# Patient Record
Sex: Male | Born: 1965 | Race: White | Hispanic: No | Marital: Single | Smoking: Former smoker
Health system: Southern US, Community
[De-identification: ages and names within clinical notes are randomized; demographics above are authoritative.]

## PROBLEM LIST (undated history)

## (undated) DIAGNOSIS — E785 Hyperlipidemia, unspecified: Secondary | ICD-10-CM

## (undated) DIAGNOSIS — F419 Anxiety disorder, unspecified: Secondary | ICD-10-CM

## (undated) DIAGNOSIS — I10 Essential (primary) hypertension: Secondary | ICD-10-CM

## (undated) DIAGNOSIS — E669 Obesity, unspecified: Secondary | ICD-10-CM

## (undated) DIAGNOSIS — I4891 Unspecified atrial fibrillation: Secondary | ICD-10-CM

## (undated) HISTORY — DX: Unspecified atrial fibrillation: I48.91

## (undated) HISTORY — DX: Obesity, unspecified: E66.9

## (undated) HISTORY — PX: HERNIA REPAIR: SHX51

## (undated) HISTORY — PX: OTHER SURGICAL HISTORY: SHX169

## (undated) HISTORY — PX: APPENDECTOMY: SHX54

## (undated) HISTORY — PX: HIP SURGERY: SHX245

---

## 2010-02-21 ENCOUNTER — Ambulatory Visit: Payer: Self-pay | Admitting: Family Medicine

## 2011-01-16 NOTE — Assessment & Plan Note (Signed)
Summary: NOV sinusitis   Vital Signs:  Patient profile:   45 year old male Height:      70 inches Weight:      245 pounds BMI:     35.28 O2 Sat:      98 % on Room air Temp:     98.2 degrees F oral Pulse rate:   85 / minute BP sitting:   120 / 82  (left arm) Cuff size:   large  Vitals Entered By: Payton Spark CMA (February 21, 2010 3:58 PM)  O2 Flow:  Room air CC: New to est. New to [place]. ST, sinus drainage, and cough x 2 weeks. 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    Primary Care Provider:  Seymour Bars DO  CC:  New to est. New to [place]. ST, sinus drainage, and and cough x 2 weeks. 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 Marland Kitchen  History of Present Illness: 45 yo WM presents for NOV.  He is having problems with sore throat, head congestion and cough x 2 wks.  His cough keeps him up at night.  He thinks that he has some allergies.  No ocular symptoms.  No fevers.  He usually gets a sinus infection every Winter.  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    Current Medications (verified): 1)  None  Allergies (verified): No Known Drug Allergies  Past History:  Past Medical History: obesity born with dislocated hips  Past Surgical History: hip surgery x 14 bilat hernia surgery  appy with partial colon resection  Family History: father died at 24 from CVA, HTN, ETOH mother HTN 4 sibblings healthy  Social History: Education officer, museum at [employer]. Smokes 1 ppd x 30 yrs. 3 ETOH /w k. Single.  Not in any relationships.  No kids. No regular exercise.  Review of Systems       no fevers/sweats/weakness, unexplained wt loss/gain, no change in vision, no difficulty hearing, ringing in ears,+ hay fever/allergies, no CP/discomfort, no palpitations, no breast lump/nipple discharge, + cough/wheeze, no blood in stool, no N/V/D, +  nocturia, no leaking urine, no unusual vag bleeding, no vaginal/penile discharge, no muscle/joint pain, no rash, no new/changing mole, no HA, no memory loss, no anxiety, no sleep problem, + depression, no unexplained lumps, no easy bruising/bleeding, no concern with sexual function   Physical Exam  General:  obese WM in NAD Head:  normocephalic and atraumatic.  sinuses NTTP Eyes:  conjunctiva clear Ears:  EACs patent; TMs translucent and gray with good cone of light and bony landmarks.  Nose:  copious nasal congestion Mouth:  throat mildly injected Neck:  no masses.   Lungs:  Normal respiratory effort, chest expands symmetrically. Lungs are clear to auscultation, no crackles or wheezes. dry cough Heart:  Normal rate and regular rhythm. S1 and S2 normal without gallop, murmur, click, rub or other extra sounds. Extremities:  no LE edema Skin:  color normal.   Cervical Nodes:  No lymphadenopathy noted   Impression & Recommendations:  Problem # 1:  ACUTE SINUSITIS, UNSPECIFIED (ICD-461.9) Secondary to initial URI.  Treat with Zithromax and Claritin D along with as needed Advil for HA.  Avoid smoking.  Call if not improved in 10 days. He will schedule a CPE with fasting labs in the next 2 mos.  His updated medication list for this problem includes:    Zithromax Z-pak 250 Mg Tabs (Azithromycin) .Marland Kitchen... 2 tabs by mouth x 1 day then 1 tab by mouth daily x 4 days    Claritin-d 24 Hour 10-240 Mg Xr24h-tab (Loratadine-pseudoephedrine) .Marland Kitchen... 1 tab by mouth qam  Complete Medication List: 1)  Zithromax Z-pak 250 Mg Tabs (Azithromycin) .... 2 tabs by mouth x 1 day then 1 tab by mouth daily x 4 days 2)  Claritin-d 24 Hour 10-240 Mg Xr24h-tab (Loratadine-pseudoephedrine) .Marland Kitchen.. 1 tab by mouth qam 3)  Zolpidem Tartrate 10 Mg Tabs (Zolpidem tartrate) .Marland Kitchen.. 1 tab by mouth at bedtime as needed sleep  Patient Instructions: 1)  Take Zithromax for sinusitis along with OTC Claritin -D for head congestion. 2)   Try Zolpidem at bedtime for sleep. 3)  Return for a physical with labs in 4 wks. Prescriptions: ZOLPIDEM TARTRATE 10 MG TABS (ZOLPIDEM TARTRATE) 1 tab by mouth at bedtime as needed sleep  #30 x 0   Entered and Authorized by:   Seymour Bars DO   Signed by:   Seymour Bars DO on 02/21/2010   Method used:   Printed then faxed to ...       [address] [phone]* (retail)       [address] [place], [place]  [address]       Ph: [phone]       Fax: [phone]   RxID:   [id number] ZITHROMAX Z-PAK 250 MG TABS (AZITHROMYCIN) 2 tabs by mouth x 1 day then 1 tab by mouth daily x 4 days  #1 pack x 0   Entered and Authorized by:   Seymour Bars DO   Signed by:   Seymour Bars DO on 02/21/2010   Method used:   Electronically to        [facility] [phone]* (retail)       [address] [place], [place]  [address]       Ph: [phone]       Fax: [phone]   RxID:   [id number]

## 2011-07-18 ENCOUNTER — Encounter: Payer: Self-pay | Admitting: Family Medicine

## 2011-07-19 ENCOUNTER — Other Ambulatory Visit: Payer: Self-pay | Admitting: Family Medicine

## 2011-07-19 ENCOUNTER — Encounter: Payer: Self-pay | Admitting: Family Medicine

## 2011-07-19 ENCOUNTER — Ambulatory Visit (INDEPENDENT_AMBULATORY_CARE_PROVIDER_SITE_OTHER): Payer: BC Managed Care – PPO | Admitting: Family Medicine

## 2011-07-19 DIAGNOSIS — Z13228 Encounter for screening for other metabolic disorders: Secondary | ICD-10-CM

## 2011-07-19 DIAGNOSIS — Z0000 Encounter for general adult medical examination without abnormal findings: Secondary | ICD-10-CM

## 2011-07-19 DIAGNOSIS — N139 Obstructive and reflux uropathy, unspecified: Secondary | ICD-10-CM

## 2011-07-19 DIAGNOSIS — Z130 Encounter for screening for diseases of the blood and blood-forming organs and certain disorders involving the immune mechanism: Secondary | ICD-10-CM

## 2011-07-19 DIAGNOSIS — Z13220 Encounter for screening for lipoid disorders: Secondary | ICD-10-CM

## 2011-07-19 DIAGNOSIS — Z131 Encounter for screening for diabetes mellitus: Secondary | ICD-10-CM

## 2011-07-19 DIAGNOSIS — R635 Abnormal weight gain: Secondary | ICD-10-CM

## 2011-07-19 MED ORDER — BUPROPION HCL ER (XL) 150 MG PO TB24: 150.0000 mg | ORAL_TABLET | ORAL | Status: DC

## 2011-07-19 NOTE — Patient Instructions (Signed)
Fasting labs today. Will call you w/ results tomorrow.  Start Wellbutrin every morning for mood.  It will take about 3 wks to really start working. Call if any problems.  Work on [employer], regular exercise, wt loss.  Take a Multivitamin daily.  Call if you want to see a urologist.  Return for f/u mood in 6 wks.

## 2011-07-19 NOTE — Progress Notes (Signed)
  Subjective:    Patient ID: Albert Humphrey, male    DOB: 1966/07/14, 45 y.o.   MRN: [id number]  HPI  45 yo WM presents for CPE.  He has not been seen in over a year.  He has been feeling OK.  He is a smoker.  He is having to get up 3-4 x at night to void.  He is having incomplete voiding.  He has never had urology problems.  He is thirsty all the time.  He has fam hx of depression.  He has a fam hx of premature stroke.  He has gained more weight and admits to feeling depressed especially when he is alone.    BP 133/93  Pulse 90  Ht 5\' 10"  (1.778 m)  Wt 271 lb (122.925 kg)  BMI 38.88 kg/m2  SpO2 95%  Review of Systems  Constitutional: Positive for unexpected weight change. Negative for fatigue.  Eyes: Negative for visual disturbance.  Respiratory: Negative for shortness of breath.   Cardiovascular: Negative for chest pain, palpitations and leg swelling.  Gastrointestinal: Negative for blood in stool.  Genitourinary: Positive for frequency and difficulty urinating.  Psychiatric/Behavioral: Positive for sleep disturbance and dysphoric mood. The patient is nervous/anxious.        Objective:   Physical Exam  Constitutional: He appears well-developed and well-nourished.  HENT:  Nose: Nose normal.  Mouth/Throat: Oropharynx is clear and moist.  Eyes: Conjunctivae are normal. No scleral icterus.  Neck: No thyromegaly present.  Cardiovascular: Normal rate, regular rhythm and normal heart sounds.   Pulmonary/Chest: Effort normal and breath sounds normal.  Abdominal: Soft. Bowel sounds are normal. He exhibits no distension. There is no tenderness.  Genitourinary: Prostate normal. Guaiac negative stool.  Musculoskeletal: He exhibits edema (1+ nonpitting LE edema).  Lymphadenopathy:    He has no cervical adenopathy.  Skin: Skin is warm and dry.  Psychiatric: He has a normal mood and affect.          Assessment & Plan:  Assesment:  1. CPE- Keeping healthy checklist for men reviewed  today.  BP at goal.  BMI 38.8 in the class II obesity range.     Labs ordered today.  prostate exam is normal today.  I suggested he cut back on evening intake of diet coke and call if urinary symptoms do not improve. Encouraged healthy diet, regular exercise, MVI daily. Return for next physical in 1 yr.   He thinks his Tetanus is UTD. Start Wellbutrin for for mood and call if any problems.  RTC for f/u in 6 wks.

## 2011-07-20 ENCOUNTER — Telehealth: Payer: Self-pay | Admitting: Family Medicine

## 2011-07-20 LAB — PSA: PSA: 0.51 ng/mL (ref <=4.00)

## 2011-07-20 LAB — COMPLETE METABOLIC PANEL WITH GFR
ALT: 35 U/L (ref 0–53)
AST: 25 U/L (ref 0–37)
Albumin: 4.6 g/dL (ref 3.5–5.2)
Alkaline Phosphatase: 68 U/L (ref 39–117)
BUN: 17 mg/dL (ref 6–23)
CO2: 25 mEq/L (ref 19–32)
Calcium: 9.9 mg/dL (ref 8.4–10.5)
Chloride: 104 mEq/L (ref 96–112)
Creat: 0.62 mg/dL (ref 0.50–1.35)
GFR, Est African American: >60 mL/min (ref >60)
GFR, Est Non African American: >60 mL/min (ref >60)
Glucose, Bld: 107 mg/dL — ABNORMAL HIGH (ref 70–99)
Potassium: 4.5 mEq/L (ref 3.5–5.3)
Sodium: 140 mEq/L (ref 135–145)
Total Bilirubin: 0.5 mg/dL (ref 0.3–1.2)
Total Protein: 7.6 g/dL (ref 6.0–8.3)

## 2011-07-20 LAB — HEMOGLOBIN A1C
Hgb A1c MFr Bld: 5.8 % — ABNORMAL HIGH (ref <5.7)
Mean Plasma Glucose: 120 mg/dL — ABNORMAL HIGH (ref <117)

## 2011-07-20 LAB — TSH: TSH: 1.39 u[IU]/mL (ref 0.350–4.500)

## 2011-07-20 LAB — LIPID PANEL
Cholesterol: 322 mg/dL — ABNORMAL HIGH (ref 0–200)
HDL: 42 mg/dL (ref >39)
Total CHOL/HDL Ratio: 7.7 Ratio
Triglycerides: 520 mg/dL — ABNORMAL HIGH (ref <150)

## 2011-07-20 LAB — LDL CHOLESTEROL, DIRECT: Direct LDL: 161 mg/dL — ABNORMAL HIGH

## 2011-07-20 NOTE — Telephone Encounter (Signed)
Lab added

## 2011-07-20 NOTE — Telephone Encounter (Signed)
Pls see if the lab will add an a direct LDL dx: hyperlpidemia

## 2011-07-24 ENCOUNTER — Telehealth: Payer: Self-pay | Admitting: Family Medicine

## 2011-07-24 DIAGNOSIS — E785 Hyperlipidemia, unspecified: Secondary | ICD-10-CM

## 2011-07-24 MED ORDER — NIACIN-SIMVASTATIN ER 500-20 MG PO TB24: 1.0000 | ORAL_TABLET | Freq: Every day | ORAL | Status: DC

## 2011-07-24 NOTE — Telephone Encounter (Signed)
Pt called to get his lab results.  When looking back in the telephone notes the nurse had already tried to reach him earlier today. Plan:  Albert Humphrey over all the orders and instructions that the nurse had given him on voice mail message earlier today. [name], LPN [name]

## 2011-07-24 NOTE — Telephone Encounter (Signed)
Pls let pt know that his cholesterol came back very high with both very high TGs > 500 and an LDL of 161 which should be <130.  Will need to start him on medication to reduce this as well as to work on healthy diet and regular exercise.  Will start Simcor once daily at bedtime.  Call if any problems.  Recheck FLP with LFTs in 2 mos.

## 2011-10-08 ENCOUNTER — Ambulatory Visit (INDEPENDENT_AMBULATORY_CARE_PROVIDER_SITE_OTHER): Payer: BC Managed Care – PPO | Admitting: Family Medicine

## 2011-10-08 ENCOUNTER — Encounter: Payer: Self-pay | Admitting: Family Medicine

## 2011-10-08 VITALS — BP 141/89 | HR 98 | Wt 252.0 lb

## 2011-10-08 DIAGNOSIS — I10 Essential (primary) hypertension: Secondary | ICD-10-CM

## 2011-10-08 DIAGNOSIS — F419 Anxiety disorder, unspecified: Secondary | ICD-10-CM | POA: Insufficient documentation

## 2011-10-08 DIAGNOSIS — R030 Elevated blood-pressure reading, without diagnosis of hypertension: Secondary | ICD-10-CM

## 2011-10-08 DIAGNOSIS — F411 Generalized anxiety disorder: Secondary | ICD-10-CM

## 2011-10-08 DIAGNOSIS — R7309 Other abnormal glucose: Secondary | ICD-10-CM

## 2011-10-08 DIAGNOSIS — IMO0001 Reserved for inherently not codable concepts without codable children: Secondary | ICD-10-CM

## 2011-10-08 DIAGNOSIS — E785 Hyperlipidemia, unspecified: Secondary | ICD-10-CM

## 2011-10-08 NOTE — Progress Notes (Signed)
  Subjective:    Patient ID: Albert Humphrey, male    DOB: 23-Feb-1966, 45 y.o.   MRN: [id number]  HPI  Anxiety - doing well on the 150. No side effects but says it works some but not great.   Lipids- Has been on weight watchers and has lost 17 lbs. Toleratig the simcor well but says it is $70 per months  No myalgias.    Review of Systems     Objective:   Physical Exam  Constitutional: He is oriented to person, place, and time. He appears well-developed and well-nourished.  HENT:  Head: Normocephalic and atraumatic.  Cardiovascular: Normal rate, regular rhythm and normal heart sounds.   Pulmonary/Chest: Effort normal and breath sounds normal.  Neurological: He is alert and oriented to person, place, and time.  Skin: Skin is warm and dry.  Psychiatric: He has a normal mood and affect. His behavior is normal.          Assessment & Plan:  Anxiety - Discussed trial of increasing to 300mg  a day. FU in 8 weeks. Perform GAD-7 at that time. If still not at goal, consider an SSRI.   Hyperlipidemia - Will recheck levels today. Adjust med but may need to separate components bc of cost. He can also check online for a coupon card. Keep up the weight loss. This should improve his numbers.   BP elevated - Recheck in 8 weeks. Congratulated him on his weight loss and encouraged him to keep it up. Hopefully BP will look even better.

## 2011-10-08 NOTE — Patient Instructions (Addendum)
We will call you with your lab results and see if we need to adjust your dose. Hypercholesterolemia High Blood Cholesterol Cholesterol is a white, waxy, fat-like protein needed by your body in small amounts. The liver makes all the cholesterol you need. It is carried from the liver by the blood through the blood vessels. Deposits (plaque) may build up on blood vessel walls. This makes the arteries narrower and stiffer. Plaque increases the risk for heart attack and stroke. You cannot feel your cholesterol level even if it is very high. The only way to know is by a blood test to check your lipid (fats) levels. Once you know your cholesterol levels, you should keep a record of the test results. Work with your caregiver to to keep your levels in the desired range. WHAT THE RESULTS MEAN:  Total cholesterol is a rough measure of all the cholesterol in your blood.     LDL is the so-called bad cholesterol. This is the type that deposits cholesterol in the walls of the arteries. You want this level to be low.     HDL is the good cholesterol because it cleans the arteries and carries the LDL away. You want this level to be high.     Triglycerides are fat that the body can either burn for energy or store. High levels are closely linked to heart disease.  DESIRED LEVELS:  Total cholesterol below 200.     LDL below 100 for people at risk, below 70 for very high risk.     HDL above 50 is good, above 60 is best.     Triglycerides below 150.  HOW TO LOWER YOUR CHOLESTEROL:  Diet.     Choose fish or white meat chicken and [place], roasted or baked. Limit fatty cuts of red meat, fried foods, and processed meats, such as sausage and lunch meat.     Eat lots of fresh fruits and vegetables. Choose whole grains, beans, pasta, potatoes and cereals.     Use only small amounts of olive, corn or canola oils. Avoid butter, mayonnaise, shortening or palm kernel oils. Avoid foods with trans-fats.     Use  skim/nonfat milk and low-fat/nonfat yogurt and cheeses. Avoid whole milk, cream, ice cream, egg yolks and cheeses. Healthy desserts include angel food cake, gingersnaps, animal crackers, hard candy, popsicles, and low-fat/nonfat frozen yogurt. Avoid pastries, cakes, pies and cookies.     Exercise.    A regular program helps decrease LDL and raises HDL.     Helps with weight control.     Do things that increase your activity level like gardening, walking, or taking the stairs.     Medication.    May be prescribed by your caregiver to help lowering cholesterol and the risk for heart disease.     You may need medicine even if your levels are normal if you have several risk factors.  HOME CARE INSTRUCTIONS    Follow your diet and exercise programs as suggested by your caregiver.     Take medications as directed.     Have blood work done when your caregiver feels it is necessary.  MAKE SURE YOU:    Understand these instructions.     Will watch your condition.     Will get help right away if you are not doing well or get worse.  Document Released: 12/03/2005 Document Revised: 08/15/2011 Document Reviewed: 05/21/2007 [facility] Patient Information 2012 [place], [place].

## 2011-10-09 LAB — COMPLETE METABOLIC PANEL WITH GFR
ALT: 26 U/L (ref 0–53)
AST: 25 U/L (ref 0–37)
Albumin: 4.9 g/dL (ref 3.5–5.2)
Alkaline Phosphatase: 86 U/L (ref 39–117)
BUN: 12 mg/dL (ref 6–23)
CO2: 24 mEq/L (ref 19–32)
Calcium: 9.8 mg/dL (ref 8.4–10.5)
Chloride: 105 mEq/L (ref 96–112)
Creat: 0.82 mg/dL (ref 0.50–1.35)
GFR, Est African American: >90 mL/min (ref >90)
GFR, Est Non African American: >90 mL/min (ref >90)
Glucose, Bld: 85 mg/dL (ref 70–99)
Potassium: 4.9 mEq/L (ref 3.5–5.3)
Sodium: 142 mEq/L (ref 135–145)
Total Bilirubin: 0.4 mg/dL (ref 0.3–1.2)
Total Protein: 7.6 g/dL (ref 6.0–8.3)

## 2011-10-09 LAB — LIPID PANEL
Cholesterol: 189 mg/dL (ref 0–200)
HDL: 39 mg/dL — ABNORMAL LOW (ref >39)
LDL Cholesterol: 89 mg/dL (ref 0–99)
Total CHOL/HDL Ratio: 4.8 Ratio
Triglycerides: 304 mg/dL — ABNORMAL HIGH (ref <150)
VLDL: 61 mg/dL — ABNORMAL HIGH (ref 0–40)

## 2011-10-09 LAB — LDL CHOLESTEROL, DIRECT: Direct LDL: 108 mg/dL — ABNORMAL HIGH

## 2011-10-11 ENCOUNTER — Telehealth: Payer: Self-pay | Admitting: *Deleted

## 2011-10-11 MED ORDER — NIACIN ER (ANTIHYPERLIPIDEMIC) 500 MG PO TBCR: 500.0000 mg | EXTENDED_RELEASE_TABLET | Freq: Every day | ORAL | Status: DC

## 2011-10-11 MED ORDER — SIMVASTATIN 20 MG PO TABS: 20.0000 mg | ORAL_TABLET | Freq: Every day | ORAL | Status: DC

## 2011-10-11 MED ORDER — BUPROPION HCL ER (XL) 300 MG PO TB24: 300.0000 mg | ORAL_TABLET | ORAL | Status: DC

## 2011-10-11 NOTE — Telephone Encounter (Signed)
Pt could not find a coupon for the symbicort so would like for you to send the separate medications. Also states there was supposed to be a rx sent for wellbutrin for a higher strength

## 2011-10-11 NOTE — Telephone Encounter (Signed)
Message copied by Wyline Beady on Thu Oct 11, 2011  8:52 AM ------      Message from: Nani Gasser D      Created: Thu Oct 11, 2011  6:40 AM       Total cholesterol and triglycerides looked much better. Complete metabolic panel is normal. Please let me know if he was able to find a coupon voucher online for the Symbicort. If he was not then let me know and I will  separate out the medication and this should hopefully be cheaper for him. But it certainly is helping his cholesterol greatly, in addition to his significant weight loss. Recheck cholesterol in 6 months.

## 2011-10-11 NOTE — Telephone Encounter (Signed)
rx sent

## 2011-10-11 NOTE — Telephone Encounter (Signed)
Left message on vm with results  

## 2011-10-11 NOTE — Telephone Encounter (Signed)
Pt returning call regarding his lab results. Plan:  When pt call was returned he said he had already talked to someone.  Pt just waiting on the scripts to go to his pharmacy that are suppose to be sent.  Albert Newcomer, LPN [name]

## 2011-10-12 ENCOUNTER — Telehealth: Payer: Self-pay | Admitting: Family Medicine

## 2011-10-12 NOTE — Telephone Encounter (Signed)
Pt informed by [facility] for him that the higher dose of Wellbutrin was sent to his pharmacy. [name], LPN [name]

## 2011-10-15 NOTE — Telephone Encounter (Signed)
Closed

## 2012-07-01 ENCOUNTER — Ambulatory Visit (INDEPENDENT_AMBULATORY_CARE_PROVIDER_SITE_OTHER): Payer: BC Managed Care – PPO | Admitting: Family Medicine

## 2012-07-01 ENCOUNTER — Ambulatory Visit (INDEPENDENT_AMBULATORY_CARE_PROVIDER_SITE_OTHER): Payer: BC Managed Care – PPO

## 2012-07-01 ENCOUNTER — Encounter: Payer: Self-pay | Admitting: Family Medicine

## 2012-07-01 VITALS — BP 133/88 | HR 75 | Ht 70.0 in | Wt 274.0 lb

## 2012-07-01 DIAGNOSIS — F17200 Nicotine dependence, unspecified, uncomplicated: Secondary | ICD-10-CM

## 2012-07-01 DIAGNOSIS — R05 Cough: Secondary | ICD-10-CM

## 2012-07-01 DIAGNOSIS — E785 Hyperlipidemia, unspecified: Secondary | ICD-10-CM

## 2012-07-01 DIAGNOSIS — J40 Bronchitis, not specified as acute or chronic: Secondary | ICD-10-CM

## 2012-07-01 DIAGNOSIS — Q6589 Other specified congenital deformities of hip: Secondary | ICD-10-CM

## 2012-07-01 DIAGNOSIS — Q659 Congenital deformity of hip, unspecified: Secondary | ICD-10-CM | POA: Insufficient documentation

## 2012-07-01 DIAGNOSIS — Z720 Tobacco use: Secondary | ICD-10-CM

## 2012-07-01 DIAGNOSIS — Z23 Encounter for immunization: Secondary | ICD-10-CM

## 2012-07-01 DIAGNOSIS — R0602 Shortness of breath: Secondary | ICD-10-CM

## 2012-07-01 DIAGNOSIS — R059 Cough, unspecified: Secondary | ICD-10-CM

## 2012-07-01 LAB — COMPLETE METABOLIC PANEL WITH GFR
ALT: 22 U/L (ref 0–53)
AST: 20 U/L (ref 0–37)
Albumin: 4.4 g/dL (ref 3.5–5.2)
Alkaline Phosphatase: 66 U/L (ref 39–117)
BUN: 10 mg/dL (ref 6–23)
CO2: 27 mEq/L (ref 19–32)
Calcium: 10.5 mg/dL (ref 8.4–10.5)
Chloride: 106 mEq/L (ref 96–112)
Creat: 0.79 mg/dL (ref 0.50–1.35)
GFR, Est African American: >89 mL/min
GFR, Est Non African American: >89 mL/min
Glucose, Bld: 105 mg/dL — ABNORMAL HIGH (ref 70–99)
Potassium: 5.1 mEq/L (ref 3.5–5.3)
Sodium: 141 mEq/L (ref 135–145)
Total Bilirubin: 0.4 mg/dL (ref 0.3–1.2)
Total Protein: 7.4 g/dL (ref 6.0–8.3)

## 2012-07-01 LAB — LIPID PANEL
Cholesterol: 244 mg/dL — ABNORMAL HIGH (ref 0–200)
HDL: 36 mg/dL — ABNORMAL LOW (ref >39)
LDL Cholesterol: 129 mg/dL — ABNORMAL HIGH (ref 0–99)
Total CHOL/HDL Ratio: 6.8 Ratio
Triglycerides: 397 mg/dL — ABNORMAL HIGH (ref <150)
VLDL: 79 mg/dL — ABNORMAL HIGH (ref 0–40)

## 2012-07-01 MED ORDER — BUPROPION HCL ER (XL) 300 MG PO TB24: 300.0000 mg | ORAL_TABLET | ORAL | Status: DC

## 2012-07-01 MED ORDER — SIMVASTATIN 40 MG PO TABS: 40.0000 mg | ORAL_TABLET | Freq: Every day | ORAL | Status: DC

## 2012-07-01 NOTE — Patient Instructions (Addendum)
We will call you with the chest xray results.  

## 2012-07-01 NOTE — Progress Notes (Signed)
Subjective:    Patient ID: Albert Humphrey, male    DOB: [birth year], 46 y.o.   MRN: [id number]  HPI Says off of all meds. Says has been more irritated and easily annoyed  Has been seeing ortho specialist for his right hip ( has had multiple surgeries since birth). Told hip replacement is the only thing to correct it.  He doesn't know what to do.  Would like to have a handicap sticker form completed.  Was given tramadol.    Tob abuse - Stopped the last 2 days . He says he is currently really struggling and feels very on it since stopping smoking 2 days ago.  Hyperlipidemia - Off of meds since Jan. No CP or SOB.    Productive cough x 2 weeks with SOB. No fever.  Had severe back pain on the left with the cough.  No cold meds.  No worsening or alleviating symptoms. In fact he says he feels slightly better today. No nasal symptoms predominantly in the chest. He says he has noticed a slight wheeze as well. No known prior history of pulmonary disorders.   Review of Systems BP 133/88  Pulse 75  Ht 5\' 10"  (1.778 m)  Wt 274 lb (124.286 kg)  BMI 39.32 kg/m2    No Known Allergies  Past Medical History  Diagnosis Date  . Obesity   . Dislocation of hip     At birth    Past Surgical History  Procedure Date  . Hip surgery   . Hernia repair   . Appendectomy   . Ectinartial colon resection     partial    History   Social History  . Marital Status: Single    Spouse Name: N/A    Number of Children: N/A  . Years of Education: N/A   Occupational History  . Not on file.   Social History Main Topics  . Smoking status: Current Everyday Smoker -- 1.0 packs/day for 30 years    Types: Cigarettes  . Smokeless tobacco: Not on file  . Alcohol Use: 1.5 oz/week    3 drink(s) per week     per week  . Drug Use:   . Sexually Active:    Other Topics Concern  . Not on file   Social History Narrative  . No narrative on file    Family History  Problem Relation Age of Onset  . Hypertension  Mother   . Hypertension Father   . Alcohol abuse Father   . Heart disease Father     CVA    Outpatient Encounter Prescriptions as of 07/01/2012  Medication Sig Dispense Refill  . buPROPion (WELLBUTRIN XL) 300 MG 24 hr tablet Take 1 tablet (300 mg total) by mouth every morning.  30 tablet  2  . niacin (NIASPAN) 500 MG CR tablet Take 1 tablet (500 mg total) by mouth at bedtime.  30 tablet  11  . simvastatin (ZOCOR) 40 MG tablet Take 1 tablet (40 mg total) by mouth at bedtime.  30 tablet  4  . DISCONTD: buPROPion (WELLBUTRIN XL) 300 MG 24 hr tablet Take 1 tablet (300 mg total) by mouth every morning.  30 tablet  2  . DISCONTD: simvastatin (ZOCOR) 20 MG tablet Take 1 tablet (20 mg total) by mouth at bedtime.  30 tablet  11          Objective:   Physical Exam  Constitutional: He is oriented to person, place, and time. He appears well-developed and well-nourished.  HENT:  Head: Normocephalic and atraumatic.  Right Ear: External ear normal.  Left Ear: External ear normal.  Nose: Nose normal.  Mouth/Throat: Oropharynx is clear and moist.       TMs and canals are clear.   Eyes: Conjunctivae and EOM are normal. Pupils are equal, round, and reactive to light.  Neck: Neck supple. No thyromegaly present.  Cardiovascular: Normal rate, regular rhythm and normal heart sounds.   Pulmonary/Chest: Effort normal and breath sounds normal. He has no wheezes.       Crackles at the bases bilaterally.   Lymphadenopathy:    He has no cervical adenopathy.  Neurological: He is alert and oriented to person, place, and time.  Skin: Skin is warm and dry.  Psychiatric: He has a normal mood and affect. His behavior is normal.          Assessment & Plan:  Hyperlipidemia - Will restart the simvastatin. Says he can't afford the wellbutrin.  Check levels today and liver enzymes.  Will check for coupon cards on niaspan.    Tob abuse - Discussed option of restarting his wellbutrin.  He will think about it.     Acute mood disorder - increase irritability and stress. Discussed restarting his Wellbutrin which did help with his mood as well as his smoking cessation.  Bronchitis - Will get CXR today, since he is having some back pain with his cough.  I actually htink he is getting better.  He can use over-the-counter cough medicines if needed.  Tdap given today.,

## 2012-10-17 ENCOUNTER — Other Ambulatory Visit: Payer: Self-pay | Admitting: Family Medicine

## 2012-10-24 ENCOUNTER — Encounter: Payer: Self-pay | Admitting: *Deleted

## 2012-10-24 ENCOUNTER — Emergency Department (INDEPENDENT_AMBULATORY_CARE_PROVIDER_SITE_OTHER)
Admission: EM | Admit: 2012-10-24 | Discharge: 2012-10-24 | Disposition: A | Discharge status: Other Acute Inpatient Hospital | Payer: BC Managed Care – PPO | Source: Home / Self Care

## 2012-10-24 DIAGNOSIS — R079 Chest pain, unspecified: Secondary | ICD-10-CM

## 2012-10-24 HISTORY — DX: Anxiety disorder, unspecified: F41.9

## 2012-10-24 HISTORY — DX: Hyperlipidemia, unspecified: E78.5

## 2012-10-24 HISTORY — DX: Essential (primary) hypertension: I10

## 2012-10-24 MED ORDER — NITROGLYCERIN 0.4 MG SL SUBL
0.4000 mg | SUBLINGUAL_TABLET | Freq: Once | SUBLINGUAL | Status: AC
  Administered 2012-10-24: 0.4 mg via SUBLINGUAL

## 2012-10-24 MED ORDER — ASPIRIN 81 MG PO CHEW
324.0000 mg | CHEWABLE_TABLET | Freq: Once | ORAL | Status: AC
  Administered 2012-10-24: 324 mg via ORAL

## 2012-10-24 NOTE — ED Notes (Signed)
EMS called for transport @ 1300.

## 2012-10-24 NOTE — ED Provider Notes (Signed)
History     CSN: [id number]  Arrival date & time 10/24/12  1248   None     Chief Complaint  Patient presents with  . Chest Pain   Patient is a 46 y.o. male presenting with chest pain.  Chest Pain The chest pain began yesterday. The chest pain is unchanged. Associated with: rest and exertion  At its most intense, the pain is at 7/10. The severity of the pain is mild. The quality of the pain is described as aching and pressure-like. The pain radiates to the left shoulder and left neck. Chest pain is worsened by certain positions and exertion. He tried nothing for the symptoms. Risk factors include male gender, obesity and smoking/tobacco exposure (HTN, HLD, + family hx/o CAD ).  His past medical history is significant for hyperlipidemia. Past medical history comments: prior 30 pack year smoker; quit 4 months ago   His family medical history is significant for CAD in family, heart disease in family and hyperlipidemia in family.     Past Medical History  Diagnosis Date  . Obesity   . Dislocation of hip     At birth    Past Surgical History  Procedure Date  . Hip surgery   . Hernia repair   . Appendectomy   . Ectinartial colon resection     partial    Family History  Problem Relation Age of Onset  . Hypertension Mother   . Hypertension Father   . Alcohol abuse Father   . Heart disease Father     CVA    History  Substance Use Topics  . Smoking status: Current Every Day Smoker -- 1.0 packs/day for 30 years    Types: Cigarettes  . Smokeless tobacco: Not on file  . Alcohol Use: 1.5 oz/week    3 drink(s) per week     Comment: per week      Review of Systems  Cardiovascular: Positive for chest pain.  All other systems reviewed and are negative.    Allergies  Review of patient's allergies indicates no known allergies.  Home Medications   Current Outpatient Rx  Name  Route  Sig  Dispense  Refill  . BUPROPION HCL ER (XL) 300 MG PO TB24      TAKE ONE TABLET BY  MOUTH EVERY DAY IN THE MORNING   30 tablet   1   . NIACIN ER (ANTIHYPERLIPIDEMIC) 500 MG PO TBCR   Oral   Take 1 tablet (500 mg total) by mouth at bedtime.   30 tablet   11   . SIMVASTATIN 40 MG PO TABS   Oral   Take 1 tablet (40 mg total) by mouth at bedtime.   30 tablet   4     There were no vitals taken for this visit.  Physical Exam  Constitutional:       Obese, in minimal distress   HENT:  Head: Normocephalic and atraumatic.  Mouth/Throat: Oropharynx is clear and moist.  Eyes: Conjunctivae normal are normal. Pupils are equal, round, and reactive to light.  Neck: Normal range of motion. Neck supple.       Large neck girth    Cardiovascular: Normal rate, regular rhythm and normal heart sounds.   Pulmonary/Chest: Effort normal and breath sounds normal. He has no wheezes.       Mild L anterior chest wall TTP    Abdominal:       Obese abdomen    Musculoskeletal: Normal range of motion.  Neurological: He is alert.  Skin: Skin is warm.    ED Course  Procedures (including critical care time)  Labs Reviewed - No data to display No results found. EKG: RBBB, sinus rhythm, questionable borderline ST depressions in anterolateral leads.  1. Chest pain       MDM  Higher concern for cardiac etiology of sxs given CV history and symptoms.  ACS protocol activated.  Supplemental O2 Sublingual nitroglycerin.  Full dose ASA.  Pt does report symptomatic improvement in sxs s/p NTG from 7/10-->4/10.  EMS contacted.  Plan to transfer to ED for further evaluation.           Doree Albee, MD 10/24/12 1325

## 2012-10-24 NOTE — ED Notes (Signed)
Pt c/o chest pain and some SOB x this AM. Denies sweats, jaw pain or arm pain. Pt also c/o of slight LT shoulder discomfort.

## 2012-10-27 ENCOUNTER — Ambulatory Visit (INDEPENDENT_AMBULATORY_CARE_PROVIDER_SITE_OTHER): Payer: BC Managed Care – PPO | Admitting: Family Medicine

## 2012-10-27 ENCOUNTER — Encounter: Payer: Self-pay | Admitting: Family Medicine

## 2012-10-27 VITALS — BP 138/96 | HR 74 | Ht 70.5 in | Wt 281.0 lb

## 2012-10-27 DIAGNOSIS — F411 Generalized anxiety disorder: Secondary | ICD-10-CM

## 2012-10-27 DIAGNOSIS — E785 Hyperlipidemia, unspecified: Secondary | ICD-10-CM

## 2012-10-27 DIAGNOSIS — R0789 Other chest pain: Secondary | ICD-10-CM

## 2012-10-27 DIAGNOSIS — E669 Obesity, unspecified: Secondary | ICD-10-CM

## 2012-10-27 MED ORDER — SIMVASTATIN 40 MG PO TABS: 40.0000 mg | ORAL_TABLET | Freq: Every day | ORAL | Status: DC

## 2012-10-27 MED ORDER — BUPROPION HCL ER (XL) 300 MG PO TB24: 300.0000 mg | ORAL_TABLET | ORAL | Status: DC

## 2012-10-27 NOTE — Progress Notes (Signed)
  Subjective:    Patient ID: Albert Humphrey, male    DOB: 17-Dec-1966, 46 y.o.   MRN: [id number]  HPI Was having CP in his left chest for 2 days and came by the office. Went to [facility] and they sent him to hosp via EMS.  Has been under a lot of stress. He was kept in ED overnight. They ruled out an MI but was told needs a stress test.  He has not had more CP since being home. He is feeling better overal but still stressed. He is taking his wellbutrin daily. Did quit on his own for 6 mo but decided to restart it recently.   Hyperlipidemia - Taking his statin now. Due ot recheck labs. He has gained 20 lbs since quitting smoking.   He quit smoking in august.    Review of Systems     Objective:   Physical Exam  Constitutional: He is oriented to person, place, and time. He appears well-developed and well-nourished.  HENT:  Head: Normocephalic and atraumatic.  Cardiovascular: Normal rate, regular rhythm and normal heart sounds.   Pulmonary/Chest: Effort normal and breath sounds normal.  Neurological: He is alert and oriented to person, place, and time.  Skin: Skin is warm and dry.  Psychiatric: He has a normal mood and affect. His behavior is normal.          Assessment & Plan:  Atypical CP - He has quit smoking.  BP is up today.  On his statin daily.  Due to recheck lipids.  Will schedule for stress test. Bc of hip will have difficulty with treadmill test.    Anxiety -GAD- 7 score of 18. Uncontrolled. Working at [employer]  Having hip surgery in May, Dr. Providence Lanius.   We discussed options. He didn't want to increase his medcation or adjust it or change it.    Tob abuse - congratulated him on quitting.

## 2012-10-28 LAB — COMPLETE METABOLIC PANEL WITH GFR
ALT: 31 U/L (ref 0–53)
AST: 23 U/L (ref 0–37)
Albumin: 4.4 g/dL (ref 3.5–5.2)
Alkaline Phosphatase: 63 U/L (ref 39–117)
BUN: 12 mg/dL (ref 6–23)
CO2: 26 mEq/L (ref 19–32)
Calcium: 9.5 mg/dL (ref 8.4–10.5)
Chloride: 105 mEq/L (ref 96–112)
Creat: 0.82 mg/dL (ref 0.50–1.35)
GFR, Est African American: >89 mL/min
GFR, Est Non African American: >89 mL/min
Glucose, Bld: 92 mg/dL (ref 70–99)
Potassium: 4.7 mEq/L (ref 3.5–5.3)
Sodium: 139 mEq/L (ref 135–145)
Total Bilirubin: 0.3 mg/dL (ref 0.3–1.2)
Total Protein: 7.3 g/dL (ref 6.0–8.3)

## 2012-10-28 LAB — LIPID PANEL
Cholesterol: 184 mg/dL (ref 0–200)
HDL: 40 mg/dL (ref >39)
LDL Cholesterol: 91 mg/dL (ref 0–99)
Total CHOL/HDL Ratio: 4.6 Ratio
Triglycerides: 265 mg/dL — ABNORMAL HIGH (ref <150)
VLDL: 53 mg/dL — ABNORMAL HIGH (ref 0–40)

## 2012-11-03 ENCOUNTER — Ambulatory Visit: Payer: BC Managed Care – PPO | Admitting: Family Medicine

## 2012-11-07 ENCOUNTER — Other Ambulatory Visit: Payer: Self-pay | Admitting: Family Medicine

## 2012-11-07 DIAGNOSIS — R0789 Other chest pain: Secondary | ICD-10-CM

## 2012-12-19 ENCOUNTER — Ambulatory Visit (INDEPENDENT_AMBULATORY_CARE_PROVIDER_SITE_OTHER): Payer: BC Managed Care – PPO | Admitting: Family Medicine

## 2012-12-19 ENCOUNTER — Encounter: Payer: Self-pay | Admitting: Family Medicine

## 2012-12-19 VITALS — BP 136/86 | HR 87 | Temp 98.0°F | Resp 18 | Wt 289.0 lb

## 2012-12-19 DIAGNOSIS — E785 Hyperlipidemia, unspecified: Secondary | ICD-10-CM

## 2012-12-19 DIAGNOSIS — J329 Chronic sinusitis, unspecified: Secondary | ICD-10-CM

## 2012-12-19 DIAGNOSIS — J40 Bronchitis, not specified as acute or chronic: Secondary | ICD-10-CM

## 2012-12-19 MED ORDER — PRAVASTATIN SODIUM 40 MG PO TABS: 40.0000 mg | ORAL_TABLET | Freq: Every day | ORAL | Status: DC

## 2012-12-19 MED ORDER — ALBUTEROL SULFATE HFA 108 (90 BASE) MCG/ACT IN AERS: 2.0000 | INHALATION_SPRAY | Freq: Four times a day (QID) | RESPIRATORY_TRACT | Status: DC | PRN

## 2012-12-19 MED ORDER — HYDROCODONE-HOMATROPINE 5-1.5 MG/5ML PO SYRP: 5.0000 mL | ORAL_SOLUTION | Freq: Every evening | ORAL | Status: DC | PRN

## 2012-12-19 MED ORDER — AZITHROMYCIN 250 MG PO TABS: ORAL_TABLET | ORAL | Status: DC

## 2012-12-19 NOTE — Progress Notes (Signed)
  Subjective:    Patient ID: Albert Humphrey, male    DOB: [birth year], 47 y.o.   MRN: [id number]  HPI Dry cough x 1.5 weeks.  No fever.  Says has tried multiple episodes over the winter.  Achiness for 2- 3days.  Voice is raspy. ST initally but now better.  Feels chest is congested.  No facial pain.  INtermittant nasal congestion.  No HA.  No runny nose.  No worsening sxs.  Quit smoking last August.  Some wheezing.  No improvement at all.  Some mild SOB. + post nasal drip that has a blood taste.     Review of Systems     Objective:   Physical Exam  Constitutional: He is oriented to person, place, and time. He appears well-developed and well-nourished.  HENT:  Head: Normocephalic and atraumatic.  Right Ear: External ear normal.  Left Ear: External ear normal.  Nose: Nose normal.  Mouth/Throat: Oropharynx is clear and moist.       TMs and canals are clear.   Eyes: Conjunctivae normal and EOM are normal. Pupils are equal, round, and reactive to light.  Neck: Neck supple. No thyromegaly present.  Cardiovascular: Normal rate and normal heart sounds.   Pulmonary/Chest: Effort normal and breath sounds normal.  Lymphadenopathy:    He has no cervical adenopathy.  Neurological: He is alert and oriented to person, place, and time.  Skin: Skin is warm and dry.  Psychiatric: He has a normal mood and affect.          Assessment & Plan:  Bronchitis/sinsutis - Will tx with zpack. Says he had similar sxs about 2 years ago and it turned out to be a sinus infection.  Rx for nightime cough medication.  Can use the inhaler during the daytime for prn wheezing. If not better in one week then call and we can get CXR.   Hyperlipidemia-he says simvastatin causes knee pain. He definitely notices a correlation. He stopped and restarted it several times. Will change to pravastatin and see if he tolerates this well but better. Followup as needed.

## 2012-12-19 NOTE — Patient Instructions (Signed)

## 2013-01-16 ENCOUNTER — Other Ambulatory Visit: Payer: Self-pay | Admitting: Family Medicine

## 2013-01-31 ENCOUNTER — Other Ambulatory Visit: Payer: Self-pay

## 2013-02-14 ENCOUNTER — Other Ambulatory Visit: Payer: Self-pay | Admitting: Family Medicine

## 2013-03-19 ENCOUNTER — Ambulatory Visit (INDEPENDENT_AMBULATORY_CARE_PROVIDER_SITE_OTHER): Payer: BC Managed Care – PPO | Admitting: Family Medicine

## 2013-03-19 ENCOUNTER — Encounter: Payer: Self-pay | Admitting: Family Medicine

## 2013-03-19 VITALS — BP 141/84 | HR 71 | Ht 70.0 in | Wt 287.0 lb

## 2013-03-19 DIAGNOSIS — E669 Obesity, unspecified: Secondary | ICD-10-CM

## 2013-03-19 DIAGNOSIS — Q659 Congenital deformity of hip, unspecified: Secondary | ICD-10-CM

## 2013-03-19 DIAGNOSIS — Z01810 Encounter for preprocedural cardiovascular examination: Secondary | ICD-10-CM

## 2013-03-19 DIAGNOSIS — Q6589 Other specified congenital deformities of hip: Secondary | ICD-10-CM

## 2013-03-19 DIAGNOSIS — E785 Hyperlipidemia, unspecified: Secondary | ICD-10-CM

## 2013-03-19 NOTE — Progress Notes (Addendum)
Subjective:    Patient ID: Albert Humphrey, male    DOB: 18-Mar-1966, 47 y.o.   MRN: [id number]  HPI Schedule for Right Hip replacement with [facility]. He scheduled for 04/20/2013. He is here today for preop clearance. He has no prior history of heart disease or pulmonary disease. He does have an inhaler that he's used a couple times when he had acute bronchitis. He has a history of anxiety gets currently well controlled on Wellbutrin. Also a history of hyperlipidemia which is well controlled on pravastatin. He is due for repeat labs.. He quit smoking last August but unfortunately has gained a lot of weight and that time. 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 He has had multiple prior surgeries on the hip.  He is not having an CP or SOB. No lower extremity edema, dizziness.  Patient Active Problem List  Diagnosis  . Dyslipidemia  . Anxiety  . Congenital hip deformity  . Obesity (BMI 35.0-39.9 without comorbidity)     Review of Systems Comprehensive review of systems is negative.  There were no vitals taken for this visit.    Allergies  Allergen Reactions  . Simvastatin Other (See Comments)    Joint aches    Past Medical History  Diagnosis Date  . Obesity   . Dislocation of hip     At birth  . Hypertension   . Anxiety   . Dyslipidemia     Past Surgical History  Procedure Laterality Date  . Hip surgery    . Hernia repair    . Appendectomy    . Ectinartial colon resection      partial    History   Social History  . Marital Status: Single    Spouse Name: N/A    Number of Children: N/A  . Years of Education: N/A   Occupational History  . Teacher    Social History Main Topics  . Smoking status: Former Smoker -- 1.00 packs/day for 30 years    Types: Cigarettes    Start date: 07/24/2012  . Smokeless tobacco: Not on file  . Alcohol Use: 1.5 oz/week    3 drink(s) per week      Comment: per week  . Drug Use: No  . Sexually Active: Not on file   Other Topics Concern  . Not on file   Social History Narrative   No regular exercise.  Large amt of caffeine.     Family History  Problem Relation Age of Onset  . Hypertension Mother   . Hypertension Father   . Alcohol abuse Father   . Heart disease Father     CVA  . Stroke Father   . Heart attack Other     Outpatient Encounter Prescriptions as of 03/19/2013  Medication Sig Dispense Refill  . albuterol (PROVENTIL HFA;VENTOLIN HFA) 108 (90 BASE) MCG/ACT inhaler Inhale 2 puffs into the lungs every 6 (six) hours as needed for wheezing.  1 Inhaler  0  . buPROPion (WELLBUTRIN XL) 300 MG 24 hr tablet TAKE ONE TABLET BY MOUTH EVERY DAY IN THE MORNING  30 tablet  3  . pravastatin (PRAVACHOL) 40 MG tablet TAKE ONE TABLET BY MOUTH AT BEDTIME  30 tablet  3  . [DISCONTINUED] azithromycin (ZITHROMAX) 250 MG tablet 2 tabs on Day 1, 2 tabs daily x 4 days.  6 each  0  . [DISCONTINUED] buPROPion (WELLBUTRIN XL) 300 MG 24 hr tablet  Take 1 tablet (300 mg total) by mouth every morning.  30 tablet  4  . [DISCONTINUED] HYDROcodone-homatropine (HYCODAN) 5-1.5 MG/5ML syrup Take 5 mLs by mouth at bedtime as needed for cough.  180 mL  0   No facility-administered encounter medications on file as of 03/19/2013.          Objective:   Physical Exam  Constitutional: He is oriented to person, place, and time. He appears well-developed and well-nourished.  HENT:  Head: Normocephalic and atraumatic.  Right Ear: External ear normal.  Left Ear: External ear normal.  Nose: Nose normal.  Mouth/Throat: Oropharynx is clear and moist.  Eyes: Conjunctivae and EOM are normal. Pupils are equal, round, and reactive to light.  Neck: Normal range of motion. Neck supple. No thyromegaly present.  Cardiovascular: Normal rate, regular rhythm, normal heart sounds and intact distal pulses.   Pulmonary/Chest: Effort normal and breath sounds normal.   Abdominal: Soft. Bowel sounds are normal. He exhibits no distension and no mass. There is no tenderness. There is no rebound and no guarding.  Musculoskeletal: Normal range of motion.  Lymphadenopathy:    He has no cervical adenopathy.  Neurological: He is alert and oriented to person, place, and time. He has normal reflexes.  Skin: Skin is warm and dry.  Psychiatric: He has a normal mood and affect. His behavior is normal. Judgment and thought content normal.          Assessment & Plan:  Presurgical clearance-he overall is low risk. No history of cardiac or pulmonary disease. He has used an inhaler occasionally for acute bronchitis but does not have an underlying pulmonary disorder. He is currently well controlled on current medications and does have a history of hyperlipidemia but not of hypertension. I think is a good candidate for right hip replacement and he is cleared for surgery. Please see attached EKG as well as copy of lab work. Please see completed form.  EKG shows rate of 74 beats per minute, normal sinus rhythm. Normal axis. Incomplete right bundle branch block. No acute changes. This is unchanged from previous EKG from November 2013  Obesity-discussed the importance of weight loss. Even if unable to exercise regularly at this point he really needs to focus on his diet. Recommend use my fitness PAL to -calorie count. I really stressed to him that it's important that he try to lose a few pounds before his surgery. I think it will help his recovery. Lab Results  Component Value Date   WBC 7.1 03/19/2013   HGB 14.8 03/19/2013   HCT 42.3 03/19/2013   MCV 87.6 03/19/2013   PLT 260 03/19/2013     Chemistry      Component Value Date/Time   NA 139 03/19/2013 1312   K 4.2 03/19/2013 1312   CL 102 03/19/2013 1312   CO2 27 03/19/2013 1312   BUN 12 03/19/2013 1312   CREATININE 0.78 03/19/2013 1312      Component Value Date/Time   CALCIUM 9.7 03/19/2013 1312   ALKPHOS 71 03/19/2013 1312   AST 23  03/19/2013 1312   ALT 30 03/19/2013 1312   BILITOT 0.5 03/19/2013 1312     Lab Results  Component Value Date   CHOL 260* 03/19/2013   HDL 41 03/19/2013   LDLCALC 956* 03/19/2013   LDLDIRECT 108* 10/08/2011   TRIG 280* 03/19/2013   CHOLHDL 6.3 03/19/2013

## 2013-03-20 ENCOUNTER — Telehealth: Payer: Self-pay | Admitting: Family Medicine

## 2013-03-20 ENCOUNTER — Telehealth: Payer: Self-pay

## 2013-03-20 LAB — LIPID PANEL
Cholesterol: 260 mg/dL — ABNORMAL HIGH (ref 0–200)
HDL: 41 mg/dL (ref >39)
LDL Cholesterol: 163 mg/dL — ABNORMAL HIGH (ref 0–99)
Total CHOL/HDL Ratio: 6.3 Ratio
Triglycerides: 280 mg/dL — ABNORMAL HIGH (ref <150)
VLDL: 56 mg/dL — ABNORMAL HIGH (ref 0–40)

## 2013-03-20 LAB — CBC
HCT: 42.3 % (ref 39.0–52.0)
Hemoglobin: 14.8 g/dL (ref 13.0–17.0)
MCH: 30.6 pg (ref 26.0–34.0)
MCHC: 35 g/dL (ref 30.0–36.0)
MCV: 87.6 fL (ref 78.0–100.0)
Platelets: 260 10*3/uL (ref 150–400)
RBC: 4.83 MIL/uL (ref 4.22–5.81)
RDW: 14.5 % (ref 11.5–15.5)
WBC: 7.1 10*3/uL (ref 4.0–10.5)

## 2013-03-20 LAB — COMPLETE METABOLIC PANEL WITH GFR
ALT: 30 U/L (ref 0–53)
AST: 23 U/L (ref 0–37)
Albumin: 4.9 g/dL (ref 3.5–5.2)
Alkaline Phosphatase: 71 U/L (ref 39–117)
BUN: 12 mg/dL (ref 6–23)
CO2: 27 mEq/L (ref 19–32)
Calcium: 9.7 mg/dL (ref 8.4–10.5)
Chloride: 102 mEq/L (ref 96–112)
Creat: 0.78 mg/dL (ref 0.50–1.35)
GFR, Est African American: >89 mL/min
GFR, Est Non African American: >89 mL/min
Glucose, Bld: 83 mg/dL (ref 70–99)
Potassium: 4.2 mEq/L (ref 3.5–5.3)
Sodium: 139 mEq/L (ref 135–145)
Total Bilirubin: 0.5 mg/dL (ref 0.3–1.2)
Total Protein: 7.2 g/dL (ref 6.0–8.3)

## 2013-03-20 MED ORDER — HYDROXYZINE PAMOATE 100 MG PO CAPS: 100.0000 mg | ORAL_CAPSULE | Freq: Three times a day (TID) | ORAL | Status: DC | PRN

## 2013-03-20 NOTE — Telephone Encounter (Signed)
Pt called back I informed him that his information had been faxed and I also told him that the number that we have listed for him was d/c'd and asked if there was another number that he would like to provide for future contact. [phone] I changed this on his demographics.Albert Humphrey [place]

## 2013-03-20 NOTE — Telephone Encounter (Signed)
Called the number listed for pt and it was d/c'd will call his EC and lvm for him to return call.Loralee Pacas [place]

## 2013-03-20 NOTE — Telephone Encounter (Signed)
Albert Humphrey left a message on the nurse line stating he wants a prescription of Atarax. This medication is not on his medication list. Is this appropriate?

## 2013-03-20 NOTE — Telephone Encounter (Signed)
Sent rx.

## 2013-03-20 NOTE — Telephone Encounter (Signed)
Please call patient and let him know that we're faxing over his clearance including his EKG and his labs today.

## 2013-03-22 NOTE — Telephone Encounter (Signed)
Left detailed message.   

## 2013-03-23 ENCOUNTER — Encounter: Payer: Self-pay | Admitting: *Deleted

## 2013-03-30 ENCOUNTER — Telehealth: Payer: Self-pay | Admitting: *Deleted

## 2013-03-30 NOTE — Telephone Encounter (Addendum)
Pt called back stated that he also had a burning sensation in his stomach. Asked pt if he had eaten any spicy foods he stated that he had however, this has never been a problem for him before. He said that it felt as if he was experiencing flu like sxs because of how his body was aching, unsure if he had an actual fever. Today feels better. Asked if he was having anymore problems with diarrhea he stated that he was not. Pt's phone was going in and out will try and call back.Loralee Pacas [place]

## 2013-03-30 NOTE — Telephone Encounter (Signed)
Pt called and lvm stating that he having diarrhea over the weekend and that his stools were dark. He stated that they are now brown and wanted some advice.Laureen Ochs, Amyri Frenz [place]   Called pt back and lvm asking him to return call.Loralee Pacas [place]

## 2013-03-31 ENCOUNTER — Telehealth: Payer: Self-pay | Admitting: *Deleted

## 2013-03-31 NOTE — Telephone Encounter (Signed)
LMOM informing pt that if he is feeling better then there is no need for him to be seen again for the diarrhea before his surgery  but if it continues then he should make an appt.

## 2013-04-20 HISTORY — PX: TOTAL HIP ARTHROPLASTY: SHX124

## 2013-05-12 ENCOUNTER — Other Ambulatory Visit: Payer: Self-pay | Admitting: Family Medicine

## 2013-05-15 ENCOUNTER — Encounter: Payer: Self-pay | Admitting: Family Medicine

## 2013-06-16 ENCOUNTER — Other Ambulatory Visit: Payer: Self-pay | Admitting: Family Medicine

## 2013-06-16 ENCOUNTER — Encounter: Payer: Self-pay | Admitting: Family Medicine

## 2013-06-16 DIAGNOSIS — G4733 Obstructive sleep apnea (adult) (pediatric): Secondary | ICD-10-CM | POA: Insufficient documentation

## 2013-07-20 ENCOUNTER — Other Ambulatory Visit: Payer: Self-pay | Admitting: Family Medicine

## 2013-08-19 ENCOUNTER — Other Ambulatory Visit: Payer: Self-pay | Admitting: Family Medicine

## 2013-09-25 ENCOUNTER — Other Ambulatory Visit: Payer: Self-pay | Admitting: Family Medicine

## 2013-09-25 MED ORDER — BUPROPION HCL ER (XL) 300 MG PO TB24: ORAL_TABLET | ORAL | Status: DC

## 2013-10-02 ENCOUNTER — Encounter: Payer: Self-pay | Admitting: Family Medicine

## 2013-10-02 ENCOUNTER — Ambulatory Visit (INDEPENDENT_AMBULATORY_CARE_PROVIDER_SITE_OTHER): Payer: BC Managed Care – PPO | Admitting: Family Medicine

## 2013-10-02 VITALS — BP 134/81 | HR 71 | Wt 272.0 lb

## 2013-10-02 DIAGNOSIS — E785 Hyperlipidemia, unspecified: Secondary | ICD-10-CM

## 2013-10-02 DIAGNOSIS — Z23 Encounter for immunization: Secondary | ICD-10-CM

## 2013-10-02 DIAGNOSIS — F419 Anxiety disorder, unspecified: Secondary | ICD-10-CM

## 2013-10-02 DIAGNOSIS — E669 Obesity, unspecified: Secondary | ICD-10-CM

## 2013-10-02 DIAGNOSIS — F411 Generalized anxiety disorder: Secondary | ICD-10-CM

## 2013-10-02 MED ORDER — BUPROPION HCL ER (XL) 150 MG PO TB24: 150.0000 mg | ORAL_TABLET | ORAL | Status: DC

## 2013-10-02 MED ORDER — CITALOPRAM HYDROBROMIDE 20 MG PO TABS: ORAL_TABLET | ORAL | Status: DC

## 2013-10-02 NOTE — Progress Notes (Signed)
Subjective:    Patient ID: Albert Humphrey, male    DOB: [birth year], 47 y.o.   MRN: [id number]  HPI Says his hip surgery went really well. His pain is gone but still limping some. Doing PT. On nutrisystem for 3 months and has lost 22 llbs. He stopped the cholesterol pill.   Hyperlipidemia - He stopped the cholesterol pill. He felt like it was making his joints hurt. He has really been working on his diet has lost 22 pounds. He would like to see what his lipids are doing off the medication with his weight loss.  Anxiety - doing well on the Wellbutrin overall but feels like it it doesn't work as well it used too. Sister on celexa and really happy with hit. He is interested in changing the med around.      Review of Systems     BP 134/81  Pulse 71  Wt 272 lb (123.378 kg)  BMI 39.03 kg/m2    Allergies  Allergen Reactions  . Simvastatin Other (See Comments)    Joint aches    Past Medical History  Diagnosis Date  . Obesity   . Dislocation of hip     At birth  . Hypertension   . Anxiety   . Dyslipidemia     Past Surgical History  Procedure Laterality Date  . Hip surgery    . Hernia repair    . Appendectomy    . Ectinartial colon resection      partial  . 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 Doristine Counter    History   Social History  . Marital Status: Single    Spouse Name: N/A    Number of Children: N/A  . Years of Education: N/A   Occupational History  . Teacher    Social History Main Topics  . Smoking status: Former Smoker -- 1.00 packs/day for 30 years    Types: Cigarettes    Start date: 07/24/2012  . Smokeless tobacco: Not on file  . Alcohol Use: 1.5 oz/week    3 drink(s) per week     Comment: per week  . Drug Use: No  . Sexual Activity: Not on file   Other Topics Concern  . Not on file   Social History Narrative   No regular exercise.  Large amt of caffeine.     Family History  Problem Relation Age of Onset  . Hypertension Mother    . Hypertension Father   . Alcohol abuse Father   . Heart disease Father     CVA  . Stroke Father   . Heart attack Other     Outpatient Encounter Prescriptions as of 10/02/2013  Medication Sig Dispense Refill  . albuterol (PROVENTIL HFA;VENTOLIN HFA) 108 (90 BASE) MCG/ACT inhaler Inhale 2 puffs into the lungs every 6 (six) hours as needed for wheezing.  1 Inhaler  0  . buPROPion (WELLBUTRIN XL) 150 MG 24 hr tablet Take 1 tablet (150 mg total) by mouth every morning. TAKE ONE TABLET BY MOUTH IN THE MORNING  30 tablet  0  . [DISCONTINUED] buPROPion (WELLBUTRIN XL) 300 MG 24 hr tablet TAKE ONE TABLET BY MOUTH IN THE MORNING  30 tablet  0  . citalopram (CELEXA) 20 MG tablet 1/2 tab po QD x 1 week, then increase to whole tab daily.  30 tablet  0  . [DISCONTINUED] hydrOXYzine (VISTARIL) 100 MG capsule Take 1 capsule (100 mg total) by mouth 3 (three) times daily as  needed for itching.  30 capsule  0  . [DISCONTINUED] pravastatin (PRAVACHOL) 40 MG tablet TAKE ONE TABLET BY MOUTH AT BEDTIME  30 tablet  3   No facility-administered encounter medications on file as of 10/02/2013.       Objective:   Physical Exam  Constitutional: He is oriented to person, place, and time. He appears well-developed and well-nourished.  HENT:  Head: Normocephalic and atraumatic.  Cardiovascular: Normal rate, regular rhythm and normal heart sounds.   Pulmonary/Chest: Effort normal and breath sounds normal.  Neurological: He is alert and oriented to person, place, and time.  Skin: Skin is warm and dry.  Psychiatric: He has a normal mood and affect. His behavior is normal.          Assessment & Plan:  Hyperlipidemia - has slightly reasonable to recheck lipids before we consider restarting a statin. Given lab slip today and he will go downstairs today. Continue to work on diet and exercise. He has done fantastic so far.  Anxiety - Will add celexa and start to taper off the wellbutrin.  Dec to 150mg .  F/U in  1 months. If doing well then we'll adjust the Celexa is deviated and completely wean off the Wellbutrin.  BMI 39/Obesity-he is on fantastic with his weight loss. Encouraged him to keep it up and continue to work on diet and exercise.

## 2013-10-03 LAB — LIPID PANEL
Cholesterol: 229 mg/dL — ABNORMAL HIGH (ref 0–200)
HDL: 37 mg/dL — ABNORMAL LOW (ref >39)
LDL Cholesterol: 132 mg/dL — ABNORMAL HIGH (ref 0–99)
Total CHOL/HDL Ratio: 6.2 Ratio
Triglycerides: 302 mg/dL — ABNORMAL HIGH (ref <150)
VLDL: 60 mg/dL — ABNORMAL HIGH (ref 0–40)

## 2013-10-23 ENCOUNTER — Ambulatory Visit: Payer: BC Managed Care – PPO | Admitting: Family Medicine

## 2013-10-27 ENCOUNTER — Encounter: Payer: Self-pay | Admitting: Family Medicine

## 2013-10-27 ENCOUNTER — Ambulatory Visit (INDEPENDENT_AMBULATORY_CARE_PROVIDER_SITE_OTHER): Payer: BC Managed Care – PPO | Admitting: Family Medicine

## 2013-10-27 VITALS — BP 116/73 | HR 65 | Wt 268.0 lb

## 2013-10-27 DIAGNOSIS — F32A Depression, unspecified: Secondary | ICD-10-CM

## 2013-10-27 DIAGNOSIS — F329 Major depressive disorder, single episode, unspecified: Secondary | ICD-10-CM

## 2013-10-27 DIAGNOSIS — Z5181 Encounter for therapeutic drug level monitoring: Secondary | ICD-10-CM

## 2013-10-27 DIAGNOSIS — F341 Dysthymic disorder: Secondary | ICD-10-CM

## 2013-10-27 DIAGNOSIS — E669 Obesity, unspecified: Secondary | ICD-10-CM

## 2013-10-27 MED ORDER — CITALOPRAM HYDROBROMIDE 40 MG PO TABS: 40.0000 mg | ORAL_TABLET | Freq: Every day | ORAL | Status: DC

## 2013-10-27 MED ORDER — CITALOPRAM HYDROBROMIDE 20 MG PO TABS: 40.0000 mg | ORAL_TABLET | Freq: Every day | ORAL | Status: DC

## 2013-10-27 NOTE — Progress Notes (Signed)
  Subjective:    Patient ID: Albert Humphrey, male    DOB: 1966/12/03, 47 y.o.   MRN: [id number]  HPI Mood - not noticing much difference between the citalopram and the wellbutrin.   He says his sister actually takes 40 mg of citalopram and wonders if that will work better. He is on a statin medication. He has not had any negative side effects on the medication. A stress levels are about the same. Still having some sleep issues more than half the days. Still feels down and has pleasure in doing things several days out of the week but not more than half. Still has some difficulty concentrating and feels like he worries too much.  Obesity - He continues to work on weight loss. He is on nutrisystem.   Wt Readings from Last 3 Encounters:  10/27/13 268 lb (121.564 kg)  10/02/13 272 lb (123.378 kg)  03/19/13 287 lb (130.182 kg)   Temp Readings from Last 3 Encounters:  12/19/12 98 F (36.7 C) Oral   BP Readings from Last 3 Encounters:  10/27/13 116/73  10/02/13 134/81  03/19/13 141/84   Pulse Readings from Last 3 Encounters:  10/27/13 65  10/02/13 71  03/19/13 71    Review of Systems     Objective:   Physical Exam  Constitutional: He is oriented to person, place, and time. He appears well-developed and well-nourished.  HENT:  Head: Normocephalic and atraumatic.  Cardiovascular: Normal rate, regular rhythm and normal heart sounds.   Pulmonary/Chest: Effort normal and breath sounds normal.  Neurological: He is alert and oriented to person, place, and time.  Skin: Skin is warm and dry.  Psychiatric: He has a normal mood and affect. His behavior is normal.          Assessment & Plan:  Anxiety/depression - PHQ 9 score of 8 today. Gad 7 score of 7 today. Will completely wean the Wellbutrin. He can take the Wellbutrin every other day for the next 10 days and then stop. We will increase citalopram to 40 mg. We'll do an EKG today and then repeat in one month at followup to rule out  potential for QT prolongation.  EKG shows rate of 69 beats per minute, normal sinus rhythm with normal axis. Incomplete right bundle branch block which is stable from previous EKG. No acute ST-T wave changes.  Obesity -he continues to lose weight, another 4 pounds, and has been fantastic. Encourage regular exercise and continue with weight loss. Blood pressure looks wonderful today.

## 2013-10-29 ENCOUNTER — Telehealth: Payer: Self-pay | Admitting: *Deleted

## 2013-10-29 DIAGNOSIS — Z5181 Encounter for therapeutic drug level monitoring: Secondary | ICD-10-CM

## 2013-11-05 ENCOUNTER — Ambulatory Visit: Payer: BC Managed Care – PPO | Admitting: Family Medicine

## 2013-12-08 ENCOUNTER — Encounter: Payer: Self-pay | Admitting: Family Medicine

## 2013-12-08 ENCOUNTER — Ambulatory Visit (INDEPENDENT_AMBULATORY_CARE_PROVIDER_SITE_OTHER): Payer: BC Managed Care – PPO | Admitting: Family Medicine

## 2013-12-08 VITALS — BP 137/75 | HR 64 | Temp 97.1°F | Wt 267.0 lb

## 2013-12-08 DIAGNOSIS — F341 Dysthymic disorder: Secondary | ICD-10-CM

## 2013-12-08 DIAGNOSIS — F32A Depression, unspecified: Secondary | ICD-10-CM

## 2013-12-08 DIAGNOSIS — L720 Epidermal cyst: Secondary | ICD-10-CM

## 2013-12-08 DIAGNOSIS — F329 Major depressive disorder, single episode, unspecified: Secondary | ICD-10-CM

## 2013-12-08 DIAGNOSIS — Z79899 Other long term (current) drug therapy: Secondary | ICD-10-CM

## 2013-12-08 DIAGNOSIS — L723 Sebaceous cyst: Secondary | ICD-10-CM

## 2013-12-08 MED ORDER — CITALOPRAM HYDROBROMIDE 40 MG PO TABS: 40.0000 mg | ORAL_TABLET | Freq: Every day | ORAL | Status: DC

## 2013-12-08 NOTE — Patient Instructions (Signed)

## 2013-12-08 NOTE — Progress Notes (Signed)
   Subjective:    Patient ID: Albert Humphrey, male    DOB: 13-Feb-1966, 47 y.o.   MRN: [id number]  HPI Anxiety/Depression- Off the Wellbutrin.  Dong well on citaloopram.  Felt really sleepy when first when up on the dose. But moved it to bedtime and that is better.  Stil struggling with irritability, feeling nervous and on edge.  Finding little pleasure in doing things.  Feeling tired.   Bump on right testicle x 4-5 months.  Not sure if red or irritated.  Not painful. Not sure if getting larger or not.  No recent sexually not.     Review of Systems     Objective:   Physical Exam  Constitutional: He is oriented to person, place, and time. He appears well-developed and well-nourished.  HENT:  Head: Normocephalic and atraumatic.  Genitourinary:  Small epidermal cyst on the right lateral testicle.   Neurological: He is alert and oriented to person, place, and time.  Skin: Skin is warm and dry.  Psychiatric: He has a normal mood and affect. His behavior is normal.          Assessment & Plan:  Anxiety/Depression- GAD- 7 score 13m PHQ-9 score of 5. Improved on citalopram 40mg .  Repeat EKG today.  EKg shows.  F/U in 2 months. Rate of 68 bpm, NSR, QT interval 396 ms.  Refilled medication.   Epidermal cyst - Gave reassurance.  Can either choose to ignore it or have it removed.

## 2013-12-11 ENCOUNTER — Encounter: Payer: Self-pay | Admitting: *Deleted

## 2014-03-16 ENCOUNTER — Other Ambulatory Visit: Payer: Self-pay | Admitting: Family Medicine

## 2014-03-16 ENCOUNTER — Ambulatory Visit (INDEPENDENT_AMBULATORY_CARE_PROVIDER_SITE_OTHER): Payer: BC Managed Care – PPO | Admitting: Family Medicine

## 2014-03-16 ENCOUNTER — Encounter: Payer: Self-pay | Admitting: Family Medicine

## 2014-03-16 VITALS — BP 132/95 | HR 66 | Ht 70.0 in | Wt 267.0 lb

## 2014-03-16 DIAGNOSIS — L723 Sebaceous cyst: Secondary | ICD-10-CM

## 2014-03-16 DIAGNOSIS — E785 Hyperlipidemia, unspecified: Secondary | ICD-10-CM

## 2014-03-16 DIAGNOSIS — E669 Obesity, unspecified: Secondary | ICD-10-CM

## 2014-03-16 DIAGNOSIS — L720 Epidermal cyst: Secondary | ICD-10-CM

## 2014-03-16 LAB — LIPID PANEL
Cholesterol: 258 mg/dL — ABNORMAL HIGH (ref 0–200)
HDL: 42 mg/dL (ref >39)
Total CHOL/HDL Ratio: 6.1 Ratio
Triglycerides: 409 mg/dL — ABNORMAL HIGH (ref <150)

## 2014-03-16 LAB — COMPLETE METABOLIC PANEL WITH GFR
ALT: 20 U/L (ref 0–53)
AST: 19 U/L (ref 0–37)
Albumin: 4.2 g/dL (ref 3.5–5.2)
Alkaline Phosphatase: 71 U/L (ref 39–117)
BUN: 15 mg/dL (ref 6–23)
CO2: 26 mEq/L (ref 19–32)
Calcium: 9.6 mg/dL (ref 8.4–10.5)
Chloride: 98 mEq/L (ref 96–112)
Creat: 0.68 mg/dL (ref 0.50–1.35)
GFR, Est African American: >89 mL/min
GFR, Est Non African American: >89 mL/min
Glucose, Bld: 89 mg/dL (ref 70–99)
Potassium: 4.4 mEq/L (ref 3.5–5.3)
Sodium: 134 mEq/L — ABNORMAL LOW (ref 135–145)
Total Bilirubin: 0.5 mg/dL (ref 0.2–1.2)
Total Protein: 7.9 g/dL (ref 6.0–8.3)

## 2014-03-16 MED ORDER — CITALOPRAM HYDROBROMIDE 40 MG PO TABS
40.0000 mg | ORAL_TABLET | Freq: Every day | ORAL | Status: DC
Start: 2014-03-16 — End: 2014-06-04

## 2014-03-16 NOTE — Progress Notes (Signed)
   Subjective:    Patient ID: Burr Medicoonald Ojo, male    DOB: 01/06/1966, 48 y.o.   MRN: [id number]  HPI Depression/anxiety-overall he is doing okay. He is taking the citalopram 40 mg. His impression levels have been a little bit elevated. He still reports little interest or pleasure in doing things more than half the days and feeling down several days a week. Still complains of feeling tired low energy nearly every day. Also reports feeling bad about himself. He has trouble relaxing and feels nervous and on edge several days a week.  Epidermal cyst on the right lateral scrotum. And evaluated the last time I saw him. He decided he would like to have it removed as it bothersome.   Review of Systems     Objective:   Physical Exam  Constitutional: He is oriented to person, place, and time. He appears well-developed and well-nourished.  HENT:  Head: Normocephalic and atraumatic.  Neurological: He is alert and oriented to person, place, and time.  Skin: Skin is warm and dry.  Psychiatric: He has a normal mood and affect. His behavior is normal.          Assessment & Plan:  Depression/anxiety-Uncontrolled.  His gad 7 score is 8 today and is PHQ 9 score is 10. Discussed treatment options. He is Re: on citalopram 40 mg. discussed different treatment options including changing her citalopram to another medication. He feels like he has some acute stressors, that will likely be better in the next month or 2 and would like to continue his current regimen. We can readdress again in 3 months and if his mood is not significantly improved then recommend change medication.  Epidermal cyst-please see procedure note below for removal.   Epidermal cyst excision-verbal consent obtained after reviewed procedure and potential complications. The area was cleaned with iodine. Cold spray was used before inserting the needle. 3 cc of 1% lidocaine with epinephrine was injected around and below the lesion. A small 1  cm incision was made with a #15 blade. The contents were expressed. No pus or drainage. Cautery was used to destroy the base of the cyst. Hemostasis achieved. Blood loss was minimal. Patient tolerated the procedure well. Followup care and wound instructions reviewed.

## 2014-03-18 LAB — LDL CHOLESTEROL, DIRECT: Direct LDL: 155 mg/dL — ABNORMAL HIGH

## 2014-03-19 LAB — LDL CHOLESTEROL, DIRECT: Direct LDL: 158 mg/dL — ABNORMAL HIGH

## 2014-04-16 ENCOUNTER — Ambulatory Visit: Payer: BC Managed Care – PPO

## 2014-04-20 ENCOUNTER — Encounter: Payer: Self-pay | Admitting: *Deleted

## 2014-04-20 ENCOUNTER — Ambulatory Visit (INDEPENDENT_AMBULATORY_CARE_PROVIDER_SITE_OTHER): Payer: BC Managed Care – PPO | Admitting: Family Medicine

## 2014-04-20 VITALS — BP 119/75 | HR 58

## 2014-04-20 DIAGNOSIS — Z111 Encounter for screening for respiratory tuberculosis: Secondary | ICD-10-CM

## 2014-04-20 NOTE — Progress Notes (Signed)
   Subjective:    Patient ID: Albert Humphrey, male    DOB: 05/24/1966, 48 y.o.   MRN: [id number] Pt here for TB test.  No complications. Will return on Thursday 04/22/14 to have read. Donne AnonAmber Dexton Zwilling, CMA HPI    Review of Systems     Objective:   Physical Exam        Assessment & Plan:

## 2014-04-22 LAB — TB SKIN TEST
Induration: 0 mm
TB Skin Test: NEGATIVE

## 2014-06-04 ENCOUNTER — Ambulatory Visit (INDEPENDENT_AMBULATORY_CARE_PROVIDER_SITE_OTHER): Payer: BC Managed Care – PPO | Admitting: Family Medicine

## 2014-06-04 ENCOUNTER — Encounter: Payer: Self-pay | Admitting: Family Medicine

## 2014-06-04 VITALS — BP 122/77 | HR 64 | Ht 70.0 in | Wt 275.0 lb

## 2014-06-04 DIAGNOSIS — F411 Generalized anxiety disorder: Secondary | ICD-10-CM

## 2014-06-04 DIAGNOSIS — Z0000 Encounter for general adult medical examination without abnormal findings: Secondary | ICD-10-CM

## 2014-06-04 DIAGNOSIS — E785 Hyperlipidemia, unspecified: Secondary | ICD-10-CM

## 2014-06-04 DIAGNOSIS — F419 Anxiety disorder, unspecified: Secondary | ICD-10-CM

## 2014-06-04 MED ORDER — CITALOPRAM HYDROBROMIDE 40 MG PO TABS: 40.0000 mg | ORAL_TABLET | Freq: Every day | ORAL | Status: DC

## 2014-06-04 MED ORDER — ATORVASTATIN CALCIUM 20 MG PO TABS: 20.0000 mg | ORAL_TABLET | ORAL | Status: DC

## 2014-06-04 NOTE — Patient Instructions (Signed)
Keep up a regular exercise program and make sure you are eating a healthy diet Try to eat 4 servings of dairy a day, or if you are lactose intolerant take a calcium with vitamin D daily.  Your vaccines are up to date.   

## 2014-06-04 NOTE — Progress Notes (Signed)
Subjective:    Patient ID: Albert Humphrey, male    DOB: 06/06/1966, 48 y.o.   MRN: [id number]  HPI Here for CPE today. He did have cholesterol and CMP performed in March. In fact his postural levels were elevated and I recommended a statin, cholesterol lowering drug.  Generalized anxiety disorder-currently on citalopram 40 mg daily. He is happy with his current regimen. 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 Has been much less stressful over all. He has had a good couple weeks. He is [place] moving to [place] will be living with his sister which he is excited about.  Hyperlipidemia-he's been intolerant of simvastatin and pravastatin the past. He did get his blood work done in March and is here to review those results today.  Review of Systems Comprehensive review of systems is negative today.  BP 122/77  Pulse 64  Ht 5\' 10"  (1.778 m)  Wt 275 lb (124.739 kg)  BMI 39.46 kg/m2    Allergies  Allergen Reactions  . Pravastatin Other (See Comments)    Joint aches  . Simvastatin Other (See Comments)    Joint aches    Past Medical History  Diagnosis Date  . Obesity   . Dislocation of hip     At birth  . Hypertension   . Anxiety   . Dyslipidemia     Past Surgical History  Procedure Laterality Date  . Hip surgery    . Hernia repair    . Appendectomy    . Ectinartial colon resection      partial  . 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 Doristine Counteravid Howe    History   Social History  . Marital Status: Single    Spouse Name: N/A    Number of Children: N/A  . Years of Education: N/A   Occupational History  . Teacher    Social History Main Topics  . Smoking status: Former Smoker -- 1.00 packs/day for 30 years    Types: Cigarettes    Start date: 07/24/2012  . Smokeless tobacco: Not on file  . Alcohol Use: 1.5 oz/week    3 drink(s) per week     Comment: per week  . Drug Use: No  . Sexual Activity: Not on file    Other Topics Concern  . Not on file   Social History Narrative   No regular exercise.  Large amt of caffeine.     Family History  Problem Relation Age of Onset  . Hypertension Mother   . Hypertension Father   . Alcohol abuse Father   . Heart disease Father     CVA  . Stroke Father   . Heart attack Other     Outpatient Encounter Prescriptions as of 06/04/2014  Medication Sig  . atorvastatin (LIPITOR) 20 MG tablet Take 1 tablet (20 mg total) by mouth every other day.  . citalopram (CELEXA) 40 MG tablet Take 1 tablet (40 mg total) by mouth daily.  . [DISCONTINUED] citalopram (CELEXA) 40 MG tablet Take 1 tablet (40 mg total) by mouth daily.  . [DISCONTINUED] citalopram (CELEXA) 40 MG tablet Take 1 tablet (40 mg total) by mouth daily.          Objective:   Physical Exam  Constitutional: He is oriented to person, place, and time. He appears well-developed and well-nourished.  HENT:  Head: Normocephalic and atraumatic.  Right Ear: External ear normal.  Left Ear: External  ear normal.  Nose: Nose normal.  Mouth/Throat: Oropharynx is clear and moist.  TMs and canals are clear.   Eyes: Conjunctivae and EOM are normal. Pupils are equal, round, and reactive to light.  Neck: Neck supple. No thyromegaly present.  Cardiovascular: Normal rate and normal heart sounds.   Pulmonary/Chest: Effort normal and breath sounds normal.  Lymphadenopathy:    He has no cervical adenopathy.  Neurological: He is alert and oriented to person, place, and time.  Skin: Skin is warm and dry.  Psychiatric: He has a normal mood and affect.          Assessment & Plan:  CPE- Keep up a regular exercise program and make sure you are eating a healthy diet Try to eat 4 servings of dairy a day, or if you are lactose intolerant take a calcium with vitamin D daily.  Your vaccines are up to date.  Due for screening colonoscopy. He is willing schedule.  Anxiety - PHQ 9 score of 2 today. Previous was  10. Gas 7 score of 2 today, previous was 8. Controlled on current regimen. Continue current regimen. 90 day supply sent with one refill. Because he's moving to [place] and will not get new insurance and so probably October I will put a followup for 6 months on his sheet today.  Hyperlipidemia- reviewed results with him today He is willing to try another statin.  He is Re: tried simvastatin pravastatin experiences muscle aches. We'll try atorvastatin but encouraged him to take it every other evening instead and see if he tolerates it well. If not then we'll add to his intolerance list.

## 2014-12-01 ENCOUNTER — Other Ambulatory Visit: Payer: Self-pay | Admitting: Family Medicine

## 2015-01-02 DIAGNOSIS — R739 Hyperglycemia, unspecified: Secondary | ICD-10-CM | POA: Insufficient documentation

## 2015-01-02 DIAGNOSIS — T380X5A Adverse effect of glucocorticoids and synthetic analogues, initial encounter: Secondary | ICD-10-CM

## 2015-01-04 DIAGNOSIS — K760 Fatty (change of) liver, not elsewhere classified: Secondary | ICD-10-CM | POA: Insufficient documentation

## 2015-01-06 DIAGNOSIS — E0781 Sick-euthyroid syndrome: Secondary | ICD-10-CM | POA: Insufficient documentation

## 2015-01-06 DIAGNOSIS — D693 Immune thrombocytopenic purpura: Secondary | ICD-10-CM | POA: Insufficient documentation

## 2015-04-20 DIAGNOSIS — G08 Intracranial and intraspinal phlebitis and thrombophlebitis: Secondary | ICD-10-CM | POA: Insufficient documentation

## 2015-05-04 DIAGNOSIS — I824Y9 Acute embolism and thrombosis of unspecified deep veins of unspecified proximal lower extremity: Secondary | ICD-10-CM | POA: Insufficient documentation

## 2015-05-04 DIAGNOSIS — I4891 Unspecified atrial fibrillation: Secondary | ICD-10-CM | POA: Insufficient documentation

## 2015-05-04 DIAGNOSIS — Z7901 Long term (current) use of anticoagulants: Secondary | ICD-10-CM | POA: Insufficient documentation

## 2015-05-04 HISTORY — DX: Unspecified atrial fibrillation: I48.91

## 2015-05-09 DIAGNOSIS — R102 Pelvic and perineal pain: Secondary | ICD-10-CM | POA: Insufficient documentation

## 2015-06-06 DIAGNOSIS — K5720 Diverticulitis of large intestine with perforation and abscess without bleeding: Secondary | ICD-10-CM | POA: Insufficient documentation

## 2015-10-18 ENCOUNTER — Encounter: Payer: Self-pay | Admitting: Family Medicine

## 2015-10-18 ENCOUNTER — Ambulatory Visit (INDEPENDENT_AMBULATORY_CARE_PROVIDER_SITE_OTHER): Payer: BC Managed Care – PPO | Admitting: Family Medicine

## 2015-10-18 VITALS — BP 124/76 | HR 65 | Ht 70.0 in | Wt 312.0 lb

## 2015-10-18 DIAGNOSIS — G08 Intracranial and intraspinal phlebitis and thrombophlebitis: Secondary | ICD-10-CM

## 2015-10-18 DIAGNOSIS — E785 Hyperlipidemia, unspecified: Secondary | ICD-10-CM

## 2015-10-18 DIAGNOSIS — F32A Depression, unspecified: Secondary | ICD-10-CM

## 2015-10-18 DIAGNOSIS — F418 Other specified anxiety disorders: Secondary | ICD-10-CM

## 2015-10-18 DIAGNOSIS — I4891 Unspecified atrial fibrillation: Secondary | ICD-10-CM

## 2015-10-18 DIAGNOSIS — F419 Anxiety disorder, unspecified: Principal | ICD-10-CM

## 2015-10-18 DIAGNOSIS — F329 Major depressive disorder, single episode, unspecified: Secondary | ICD-10-CM

## 2015-10-18 MED ORDER — ROSUVASTATIN CALCIUM 20 MG PO TABS: 20.0000 mg | ORAL_TABLET | Freq: Every day | ORAL | Status: DC

## 2015-10-18 MED ORDER — CITALOPRAM HYDROBROMIDE 40 MG PO TABS: 40.0000 mg | ORAL_TABLET | Freq: Every day | ORAL | Status: DC

## 2015-10-18 MED ORDER — BUPROPION HCL ER (XL) 150 MG PO TB24: 150.0000 mg | ORAL_TABLET | ORAL | Status: AC

## 2015-10-18 NOTE — Progress Notes (Signed)
Subjective:    Patient ID: Albert Humphrey, male    DOB: 05/31/1966, 49 y.o.   MRN: [id number]  HPI Anxiety and depression - patient comes in today to follow-up for mood. He does feel like he's been a little bit more irritable lately. He does complain of feeling nervous and on edge several days a week and difficulty relaxing. He completes irritability daily. He also complains of little interest or pleasure doing things several days a week. Denies any thoughts of wanting to harm himself. + lack of focus.   Afib- he is now on xarelto.  Also dx with ITP. He was hospitalized last year. Did chem so they didn't have to take his spleen.     Hyperlipidemia - has tried 2 statins in the past and had joint pains.    Review of Systems  BP 124/76 mmHg  Pulse 65  Ht 5\' 10"  (1.778 m)  Wt 312 lb (141.522 kg)  BMI 44.77 kg/m2  SpO2 98%    Allergies  Allergen Reactions  . Pravastatin Other (See Comments)    Joint aches  . Simvastatin Other (See Comments)    Joint aches    Past Medical History  Diagnosis Date  . Obesity   . Dislocation of hip     At birth  . Hypertension   . Anxiety   . Dyslipidemia     Past Surgical History  Procedure Laterality Date  . Hip surgery    . Hernia repair    . Appendectomy    . Ectinartial colon resection      partial  . 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 Doristine Counteravid Howe    Social History   Social History  . Marital Status: Single    Spouse Name: N/A  . Number of Children: N/A  . Years of Education: N/A   Occupational History  . Teacher    Social History Main Topics  . Smoking status: Former Smoker -- 1.00 packs/day for 30 years    Types: Cigarettes    Start date: 07/24/2012  . Smokeless tobacco: Not on file  . Alcohol Use: 1.5 oz/week    3 drink(s) per week     Comment: per week  . Drug Use: No  . Sexual Activity: Not on file   Other Topics Concern  . Not on file   Social History Narrative   No regular exercise.  Large  amt of caffeine.     Family History  Problem Relation Age of Onset  . Hypertension Mother   . Hypertension Father   . Alcohol abuse Father   . Heart disease Father     CVA  . Stroke Father   . Heart attack Other     Outpatient Encounter Prescriptions as of 10/18/2015  Medication Sig  . citalopram (CELEXA) 40 MG tablet Take 1 tablet (40 mg total) by mouth daily.  . rivaroxaban (XARELTO) 20 MG TABS tablet Take 20 mg by mouth.  . [DISCONTINUED] citalopram (CELEXA) 40 MG tablet TAKE 1 TABLET BY MOUTH DAILY.  Marland Kitchen. buPROPion (WELLBUTRIN XL) 150 MG 24 hr tablet Take 1 tablet (150 mg total) by mouth every morning.  . rosuvastatin (CRESTOR) 20 MG tablet Take 1 tablet (20 mg total) by mouth daily.  . [DISCONTINUED] atorvastatin (LIPITOR) 20 MG tablet Take 1 tablet (20 mg total) by mouth every other day.   No facility-administered encounter medications on file as of 10/18/2015.          Objective:  Physical Exam  Constitutional: He is oriented to person, place, and time. He appears well-developed and well-nourished.  HENT:  Head: Normocephalic and atraumatic.  Cardiovascular: Normal rate, regular rhythm and normal heart sounds.   No carotid bruits  Pulmonary/Chest: Effort normal and breath sounds normal.  Neurological: He is alert and oriented to person, place, and time.  Skin: Skin is warm and dry.  Psychiatric: He has a normal mood and affect. His behavior is normal.          Assessment & Plan:  Anxiety/depression-PHQ 9 score of 10 today. Gad 7 score of 9. His symptoms are up from the last time he was here and he rates them as somewhat difficult. We discussed several options. Will add Wellbutrin to his current regimen and follow back up in 6 weeks. If it's not helping improve the irritability in particular no we may need to consider weaning the citalopram and trying something different  Hyperlipidemia-he is willing to try a different statin. We'll start with Crestor since it's  now generic. Encouraged him to start with a half of a tab at bedtime and see if he tolerates this well. If he doesn't we can recheck his lipids in about 6 weeks.  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 He says he may actually end up being on it for life but is not sure. He also has a diagnosis of atrial fibrillation but he actually sounds like he is in sinus rhythm today. He is following with his hematologist but does not see a cardiologist currently.

## 2016-05-01 ENCOUNTER — Other Ambulatory Visit: Payer: Self-pay | Admitting: Family Medicine

## 2016-05-23 ENCOUNTER — Telehealth: Payer: Self-pay | Admitting: Family Medicine

## 2016-05-23 NOTE — Telephone Encounter (Signed)
I tried to call patient to schedule a f/u appt for anxiety and depression with Dr.Metheney but their phone number does not work

## 2016-10-01 ENCOUNTER — Encounter: Payer: Self-pay | Admitting: Osteopathic Medicine

## 2016-10-01 ENCOUNTER — Ambulatory Visit (INDEPENDENT_AMBULATORY_CARE_PROVIDER_SITE_OTHER): Payer: BC Managed Care – PPO | Admitting: Osteopathic Medicine

## 2016-10-01 VITALS — BP 140/86 | HR 83 | Wt 316.0 lb

## 2016-10-01 DIAGNOSIS — Z7901 Long term (current) use of anticoagulants: Secondary | ICD-10-CM

## 2016-10-01 DIAGNOSIS — M109 Gout, unspecified: Secondary | ICD-10-CM | POA: Diagnosis not present

## 2016-10-01 MED ORDER — INDOMETHACIN 50 MG PO CAPS: ORAL_CAPSULE | ORAL | 1 refills | Status: DC

## 2016-10-01 MED ORDER — HYDROCODONE-ACETAMINOPHEN 5-325 MG PO TABS: 1.0000 | ORAL_TABLET | ORAL | 0 refills | Status: DC | PRN

## 2016-10-01 NOTE — Patient Instructions (Signed)
Once pain has resolved, would recommend following up with PCP for discussion of checking uric acid levels to see if you may benefit from a preventive medicine for gout.  In the meantime, recognize that there is a small but not zero chance of adverse bleeding reaction in NSAID medications taken with Xarelto. If you have abdominal pain, blood in the stool or dark tarry stool, vomiting anything that looks like blood or coffee grounds, please go to the emergency room immediately!

## 2016-10-01 NOTE — Progress Notes (Signed)
HPI: Albert Humphrey is a 50 y.o. male  who presents to [name] today, 10/01/16,  for chief complaint of:  Chief Complaint  Patient presents with  . Gout     . Context:Previous gout attack about a year ago . Location:Left great toe . Quality: Severe pain . Duration: One day . Timing: Constant . Modifying factors: no meds tried    Past medical, surgical, social and family history reviewed: Past Medical History:  Diagnosis Date  . Anxiety   . 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 Dyslipidemia   . Hypertension   . Obesity    Past Surgical History:  Procedure Laterality Date  . APPENDECTOMY    . ectinartial colon resection     partial  . HERNIA REPAIR    . HIP SURGERY    . TOTAL HIP ARTHROPLASTY Right 04/20/2013   5/5/20Dr. Doristine Counter   Social History  Substance Use Topics  . Smoking status: Former Smoker    Packs/day: 1.00    Years: 30.00    Types: Cigarettes    Start date: 07/24/2012  . Smokeless tobacco: Not on file  . Alcohol use 1.5 oz/week    3 drink(s) per week     Comment: per week   Family History  Problem Relation Age of Onset  . Hypertension Mother   . Hypertension Father   . Alcohol abuse Father   . Heart disease Father     CVA  . Stroke Father   . Heart attack Other      Current medication list and allergy/intolerance information reviewed:   Current Outpatient Prescriptions  Medication Sig Dispense Refill  . buPROPion (WELLBUTRIN XL) 150 MG 24 hr tablet Take 1 tablet (150 mg total) by mouth every morning. 30 tablet 2  . citalopram (CELEXA) 40 MG tablet Take 1 tablet (40 mg total) by mouth daily. *NO ADDITIONAL REFILLS. PLEASE CALL THE OFFICE TO SCHEDULE AN APPOINTMENT.* 90 tablet 0  . rivaroxaban (XARELTO) 20 MG TABS tablet Take 20 mg by mouth.    . rosuvastatin (CRESTOR) 20 MG tablet Take 1 tablet (20 mg total) by mouth daily. 30 tablet 5   No current facility-administered medications for this visit.     Allergies  Allergen Reactions  . Pravastatin Other (See Comments)    Joint aches  . Simvastatin Other (See Comments)    Joint aches      Review of Systems:  Constitutional:  No  fever, no chills, No recent illness  HEENT: No  headache  Cardiac: No  chest pain, No  pressure  Respiratory:  No  shortness of breath  Gastrointestinal: No  abdominal pain, No  nausea, No  vomiting,  No  blood in stool  Musculoskeletal: +new myalgia/arthralgia  Skin: No  Rash, No other wounds/concerning lesions   Exam:  There were no vitals taken for this visit.  Constitutional: VS see above. General Appearance: alert, well-developed, well-nourished, NAD  Eyes: Normal lids and conjunctive, non-icteric sclera  Ears, Nose, Mouth, Throat: MMM,  Neck: No masses, trachea midline.  Respiratory: Normal respiratory effort.  Cardiovascular: No lower extremity edema. Pedal pulse II/IV DP and PT.   Musculoskeletal: Gait Antalgic favoring the left foot. No clubbing/cyanosis of digits. Left first MTP tender, no effusion, no erythema, limited range of motion due to pain  Neurological:  Normal balance/coordination. No tremor.   Skin: warm, dry, intact. No rash/ulcer.      ASSESSMENT/PLAN:   Acute gout  involving toe of left foot, unspecified cause - Plan: indomethacin (INDOCIN) 50 MG capsule, HYDROcodone-acetaminophen (NORCO/VICODIN) 5-325 MG tablet -    Patient Instructions  Once pain has resolved, would recommend following up with PCP for discussion of checking uric acid levels to see if you may benefit from a preventive medicine for gout.  In the meantime, recognize that there is a small but not zero chance of adverse bleeding reaction in NSAID medications taken with Xarelto. If you have abdominal pain, blood in the stool or dark tarry stool, vomiting anything that looks like blood or coffee grounds, please go to the emergency room immediately!    Visit summary with medication list and  pertinent instructions was printed for patient to review. All questions at time of visit were answered - patient instructed to contact office with any additional concerns. ER/RTC precautions were reviewed with the patient. Follow-up plan: Return for routine care, recheck gout.

## 2016-10-05 ENCOUNTER — Telehealth: Payer: Self-pay

## 2016-10-05 MED ORDER — PREDNISONE 20 MG PO TABS: 20.0000 mg | ORAL_TABLET | Freq: Two times a day (BID) | ORAL | 0 refills | Status: DC

## 2016-10-05 NOTE — Telephone Encounter (Signed)
Albert HillDonald called and left a message asking for a different pain medication. He states the pain medication is not helping. Please advise.

## 2016-10-05 NOTE — Telephone Encounter (Signed)
Left message advising of recommendations.  

## 2016-10-05 NOTE — Telephone Encounter (Signed)
I sent in steroids for him to add to the opiate pain medications and the anti-inflammatories. If symptoms are severe/worse despite treatment, he may require another visit in the office to ensure nothing else is going on such as infection.

## 2017-01-25 ENCOUNTER — Encounter: Payer: Self-pay | Admitting: Family Medicine

## 2017-01-25 ENCOUNTER — Ambulatory Visit (INDEPENDENT_AMBULATORY_CARE_PROVIDER_SITE_OTHER): Payer: BC Managed Care – PPO | Admitting: Family Medicine

## 2017-01-25 VITALS — BP 127/71 | HR 65 | Ht 70.0 in | Wt 320.0 lb

## 2017-01-25 DIAGNOSIS — E785 Hyperlipidemia, unspecified: Secondary | ICD-10-CM | POA: Diagnosis not present

## 2017-01-25 DIAGNOSIS — D693 Immune thrombocytopenic purpura: Secondary | ICD-10-CM

## 2017-01-25 DIAGNOSIS — R0602 Shortness of breath: Secondary | ICD-10-CM | POA: Diagnosis not present

## 2017-01-25 DIAGNOSIS — R002 Palpitations: Secondary | ICD-10-CM | POA: Diagnosis not present

## 2017-01-25 DIAGNOSIS — R635 Abnormal weight gain: Secondary | ICD-10-CM

## 2017-01-25 DIAGNOSIS — F419 Anxiety disorder, unspecified: Secondary | ICD-10-CM

## 2017-01-25 MED ORDER — CITALOPRAM HYDROBROMIDE 10 MG PO TABS: 10.0000 mg | ORAL_TABLET | Freq: Every day | ORAL | 0 refills | Status: DC

## 2017-01-25 NOTE — Progress Notes (Addendum)
Subjective:    Patient ID: Albert Humphrey, male    DOB: 06/02/1966, 51 y.o.   MRN: [id number]  HPI   [age] year old male comes in today complaining of excessive weight gain. He says over several years he's been able to maintain his weight within a 5 pound range. However, over the last month and a half he's gained well over 15 pounds. He says that he this is very unusual since he actually has been exercising more regularly and actually eating less trying to lose weight. He says normally in the past 20 tries to make an effort he usually is able to successfully lose weight. He thought it could be the Celexa so decided he would start weaning the medication. He was currently taking 40 mg and he cut it down to half a tab and has been on that for the last 2 weeks. He didn't want to go down any further without discussing it. He also wondered if it could be a thyroid problem. Both of his sisters have thyroid problems. The last time we checked a thyroid level on him was in 2012.   He also still has been getting intermittent episodes of heart palpitations shortness of breath and dizziness. He says sometimes he it will happen with activity such as walking. He did go see a cardiologist recently and [place] which is closer to where he lives. He says that they just saw him in the office and at that time he was not having the symptoms. They did not do any additional testing such as a cardiac monitor or echocardiogram. He has not had a stress test recently.  He has had a known prior history of blood clots so he is on Xarelto long-term.  Review of Systems     Objective:   Physical Exam  Constitutional: He is oriented to person, place, and time. He appears well-developed and well-nourished.  HENT:  Head: Normocephalic and atraumatic.  Eyes: Conjunctivae and EOM are normal.  Cardiovascular: Normal rate, regular rhythm and normal heart sounds.   Pulmonary/Chest: Effort normal and breath sounds normal.   Neurological: He is alert and oriented to person, place, and time.  Skin: Skin is warm and dry. No pallor.  Psychiatric: He has a normal mood and affect. His behavior is normal.  Vitals reviewed.         Assessment & Plan:  Abnormal weight gain/obesity-will check thyroid level as he does have a significant family history for this. And with efforts to actually exercise more and eat less he is actually gained well over 15 pounds. If thyroid is normal then consider calorie counting to better have an idea of his caloric intake.  Anxiety-PHQ- 9 score of 9 and GAD- 7 score of 8, though rates sxs as not difficult.  He's actually been on the Celexa for quite sometimes I think it's worth weaning the medication just to make sure it's not causing any weight gain tight side effects that he's been on it for a long time so don't really suspect this is the underlying cause because it'll be unusual to just more recently gained weight. But I definitely think we should try weaning it to see if he still needs it. We'll switch to 10 mg tab in half and take one half tabs daily for a week, then 1 a day for week, and then half a tab for a week, and then stop. Next  Palpitations/shortness of breath-recommend a cardiac monitor for one month to try to capture these  episodes to see if he may actually be going into atrial fibrillation. He did report some remote history of being told he had A. fib before. Also weight loss would probably improve his symptoms to some degree.  ITP/history of thrombosis-on Xarelto chronically

## 2017-01-25 NOTE — Patient Instructions (Signed)
Call patient: Sent over new prescription for Celexa 10 mg. Take 1-1/2 tabs for 7 days, then decrease to 1 tab for 7 days, then decrease to half a tab for 7 days then stop. If you feel like we need to maintain you at a lower dose just call and we can send over refill.

## 2017-01-26 LAB — TSH: TSH: 1.53 mIU/L (ref 0.40–4.50)

## 2017-01-26 LAB — CBC WITH DIFFERENTIAL/PLATELET
Basophils Absolute: 74 cells/uL (ref 0–200)
Basophils Relative: 1 %
Eosinophils Absolute: 222 cells/uL (ref 15–500)
Eosinophils Relative: 3 %
HCT: 41.3 % (ref 38.5–50.0)
Hemoglobin: 14.1 g/dL (ref 13.2–17.1)
Lymphocytes Relative: 41 %
Lymphs Abs: 3034 cells/uL (ref 850–3900)
MCH: 30.7 pg (ref 27.0–33.0)
MCHC: 34.1 g/dL (ref 32.0–36.0)
MCV: 89.8 fL (ref 80.0–100.0)
MPV: 9.9 fL (ref 7.5–12.5)
Monocytes Absolute: 740 cells/uL (ref 200–950)
Monocytes Relative: 10 %
Neutro Abs: 3330 cells/uL (ref 1500–7800)
Neutrophils Relative %: 45 %
Platelets: 254 10*3/uL (ref 140–400)
RBC: 4.6 MIL/uL (ref 4.20–5.80)
RDW: 14.8 % (ref 11.0–15.0)
WBC: 7.4 10*3/uL (ref 3.8–10.8)

## 2017-01-26 LAB — PSA: PSA: 0.5 ng/mL (ref <=4.0)

## 2017-01-26 LAB — COMPLETE METABOLIC PANEL WITH GFR
ALT: 25 U/L (ref 9–46)
AST: 24 U/L (ref 10–35)
Albumin: 4.3 g/dL (ref 3.6–5.1)
Alkaline Phosphatase: 70 U/L (ref 40–115)
BUN: 17 mg/dL (ref 7–25)
CO2: 25 mmol/L (ref 20–31)
Calcium: 9.3 mg/dL (ref 8.6–10.3)
Chloride: 104 mmol/L (ref 98–110)
Creat: 0.77 mg/dL (ref 0.70–1.33)
GFR, Est African American: >89 mL/min (ref >=60)
GFR, Est Non African American: >89 mL/min (ref >=60)
Glucose, Bld: 87 mg/dL (ref 65–99)
Potassium: 4.1 mmol/L (ref 3.5–5.3)
Sodium: 137 mmol/L (ref 135–146)
Total Bilirubin: 0.5 mg/dL (ref 0.2–1.2)
Total Protein: 7.3 g/dL (ref 6.1–8.1)

## 2017-01-26 LAB — HEMOGLOBIN A1C
Hgb A1c MFr Bld: 5.7 % — ABNORMAL HIGH (ref <5.7)
Mean Plasma Glucose: 117 mg/dL

## 2017-02-08 ENCOUNTER — Telehealth: Payer: Self-pay | Admitting: Family Medicine

## 2017-02-08 MED ORDER — AZITHROMYCIN 250 MG PO TABS: ORAL_TABLET | ORAL | 0 refills | Status: AC

## 2017-02-08 NOTE — Telephone Encounter (Signed)
Pt states he was seen by PCP recently, did keep referral appt with cardiology and was fitted for a Holter monitor.   Pt went to [facility] on 02/02/17 (notes in Care Everywhere), was treated for flu. Completed Tamiflu, still having sinus issues. States the congestion, facial pain, and cough have remained. Request an antibiotic. States he has "paid so many co-pays lately" he wants to see if PCP can send in Rx without coming into office.   Routing for review.

## 2017-02-08 NOTE — Telephone Encounter (Signed)
Left VM for advising of new Rx, callback information provided.

## 2017-02-08 NOTE — Telephone Encounter (Signed)
Called in Azithromycin. Come in for appt if not better.  Nani Gasseratherine Nicolaas Savo, MD

## 2017-02-18 ENCOUNTER — Ambulatory Visit (INDEPENDENT_AMBULATORY_CARE_PROVIDER_SITE_OTHER): Payer: BC Managed Care – PPO

## 2017-02-18 ENCOUNTER — Ambulatory Visit (INDEPENDENT_AMBULATORY_CARE_PROVIDER_SITE_OTHER): Payer: BC Managed Care – PPO | Admitting: Family Medicine

## 2017-02-18 ENCOUNTER — Encounter: Payer: Self-pay | Admitting: Family Medicine

## 2017-02-18 VITALS — BP 128/73 | HR 105 | Temp 97.5°F

## 2017-02-18 DIAGNOSIS — R0602 Shortness of breath: Secondary | ICD-10-CM

## 2017-02-18 DIAGNOSIS — R059 Cough, unspecified: Secondary | ICD-10-CM

## 2017-02-18 DIAGNOSIS — R05 Cough: Secondary | ICD-10-CM

## 2017-02-18 MED ORDER — BENZONATATE 200 MG PO CAPS: 200.0000 mg | ORAL_CAPSULE | Freq: Two times a day (BID) | ORAL | 0 refills | Status: DC | PRN

## 2017-02-18 MED ORDER — PROMETHAZINE-DM 6.25-15 MG/5ML PO SYRP: 5.0000 mL | ORAL_SOLUTION | Freq: Every evening | ORAL | 0 refills | Status: DC | PRN

## 2017-02-18 MED ORDER — PREDNISONE 20 MG PO TABS: 40.0000 mg | ORAL_TABLET | Freq: Every day | ORAL | 0 refills | Status: DC

## 2017-02-18 NOTE — Progress Notes (Signed)
Subjective:    Patient ID: Albert Humphrey, male    DOB: [birth year], 51 y.o.   MRN: [id number]  HPI 51 year old male comes in today complaining of persistent respiratory symptoms. He was actually seen approximately 2 weeks ago on February 17 at local urgent care for influenza-like symptoms. He said he completed the Tamiflu. He then called here on February 23 because he really wasn't better and was continuing to have some nasal congestion facial pain and cough and so we put him on azithromycin. He says that did absolutely nothing. He still coughing he still short of breath. He still feels fatigued. He denies any fevers chills or sweats.   Review of Systems  BP 128/73   Pulse (!) 105   Temp 97.5 F (36.4 C) (Oral)   SpO2 98%     Allergies  Allergen Reactions  . Pravastatin Other (See Comments)    Joint aches  . Simvastatin Other (See Comments)    Joint aches    Past Medical History:  Diagnosis Date  . Anxiety   . Atrial fibrillation (HCC) 05/04/2015  . 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 Dyslipidemia   . Hypertension   . Obesity     Past Surgical History:  Procedure Laterality Date  . APPENDECTOMY    . ectinartial colon resection     partial  . HERNIA REPAIR    . HIP SURGERY    . TOTAL HIP ARTHROPLASTY Right 04/20/2013   5/5/20Dr. Doristine Counter    Social History   Social History  . Marital status: Single    Spouse name: N/A  . Number of children: N/A  . Years of education: N/A   Occupational History  . Teacher [employer]   Social History Main Topics  . Smoking status: Former Smoker    Packs/day: 1.00    Years: 30.00    Types: Cigarettes    Start date: 07/24/2012  . Smokeless tobacco: Never Used  . Alcohol use 1.5 oz/week    3 Standard drinks or equivalent per week     Comment: per week  . Drug use: No  . Sexual activity: Not on file   Other Topics Concern  . Not on file   Social History Narrative   No regular exercise.  Large amt of caffeine.      Family History  Problem Relation Age of Onset  . Hypertension Mother   . Hypertension Father   . Alcohol abuse Father   . Heart disease Father     CVA  . Stroke Father   . Heart attack Other     Outpatient Encounter Prescriptions as of 02/18/2017  Medication Sig  . metoprolol tartrate (LOPRESSOR) 25 MG tablet   . rivaroxaban (XARELTO) 20 MG TABS tablet Take 20 mg by mouth.  . [DISCONTINUED] citalopram (CELEXA) 10 MG tablet Take 1 tablet (10 mg total) by mouth daily.  . [DISCONTINUED] predniSONE (DELTASONE) 20 MG tablet Take 2 tablets (40 mg total) by mouth daily.   No facility-administered encounter medications on file as of 02/18/2017.          Objective:   Physical Exam  Constitutional: He is oriented to person, place, and time. He appears well-developed and well-nourished.  HENT:  Head: Normocephalic and atraumatic.  Right Ear: External ear normal.  Left Ear: External ear normal.  Nose: Nose normal.  Mouth/Throat: Oropharynx is clear and moist.  TMs and canals are clear.   Eyes: Conjunctivae and EOM are  normal. Pupils are equal, round, and reactive to light.  Neck: Neck supple. No thyromegaly present.  Cardiovascular: Normal rate and normal heart sounds.   Pulmonary/Chest: Effort normal and breath sounds normal.  Lymphadenopathy:    He has no cervical adenopathy.  Neurological: He is alert and oriented to person, place, and time.  Skin: Skin is warm and dry.  Psychiatric: He has a normal mood and affect.        Assessment & Plan:  Persistent cough after recent diagnosis of influenza-we'll get chest x-ray to evaluate for pneumonia. Will call with results once available. Think he would benefit from a round of prednisone as well.Chest x-ray is negative. We'll try to control the cough with Tessalon Perles for daytime cough syrup at bedtime and trying to suppress any reflux symptoms that could be aggravating his symptoms. Will await blood work as well.

## 2017-02-18 NOTE — Patient Instructions (Signed)
Please consider getting a colonoscopy. We would be happy to refer you for one.

## 2017-02-19 LAB — COMPLETE METABOLIC PANEL WITH GFR
ALT: 24 U/L (ref 9–46)
AST: 28 U/L (ref 10–35)
Albumin: 4.1 g/dL (ref 3.6–5.1)
Alkaline Phosphatase: 67 U/L (ref 40–115)
BUN: 10 mg/dL (ref 7–25)
CO2: 20 mmol/L (ref 20–31)
Calcium: 9.4 mg/dL (ref 8.6–10.3)
Chloride: 105 mmol/L (ref 98–110)
Creat: 0.83 mg/dL (ref 0.70–1.33)
GFR, Est African American: >89 mL/min (ref >=60)
GFR, Est Non African American: >89 mL/min (ref >=60)
Glucose, Bld: 89 mg/dL (ref 65–99)
Potassium: 4.2 mmol/L (ref 3.5–5.3)
Sodium: 137 mmol/L (ref 135–146)
Total Bilirubin: 0.5 mg/dL (ref 0.2–1.2)
Total Protein: 7.1 g/dL (ref 6.1–8.1)

## 2017-02-19 LAB — CBC WITH DIFFERENTIAL/PLATELET
Basophils Absolute: 82 cells/uL (ref 0–200)
Basophils Relative: 1 %
Eosinophils Absolute: 246 cells/uL (ref 15–500)
Eosinophils Relative: 3 %
HCT: 42.1 % (ref 38.5–50.0)
Hemoglobin: 14.3 g/dL (ref 13.2–17.1)
Lymphocytes Relative: 42 %
Lymphs Abs: 3444 cells/uL (ref 850–3900)
MCH: 30.7 pg (ref 27.0–33.0)
MCHC: 34 g/dL (ref 32.0–36.0)
MCV: 90.3 fL (ref 80.0–100.0)
MPV: 9.5 fL (ref 7.5–12.5)
Monocytes Absolute: 902 cells/uL (ref 200–950)
Monocytes Relative: 11 %
Neutro Abs: 3526 cells/uL (ref 1500–7800)
Neutrophils Relative %: 43 %
Platelets: 257 10*3/uL (ref 140–400)
RBC: 4.66 MIL/uL (ref 4.20–5.80)
RDW: 14.5 % (ref 11.0–15.0)
WBC: 8.2 10*3/uL (ref 3.8–10.8)

## 2017-02-27 ENCOUNTER — Telehealth: Payer: Self-pay

## 2017-02-27 NOTE — Telephone Encounter (Signed)
Notified patient of recommendation. 

## 2017-02-27 NOTE — Telephone Encounter (Signed)
Pt stated that he has taken his z pac, prednisone, and is still coughing to the point he is having pain in his back under his ribs.  He said the cough medicine he takes at night works well, but the pearls are not helping at all.  Please advise.

## 2017-02-27 NOTE — Telephone Encounter (Signed)
I want him to try a reflux medicine. Tell him to humor me on this.  Sometime when you get a severe cough it will trigger reflux and that will cause the cause to persist.  Try prilosec 20mg  BID. One about 30 min before breakfast and one at bedtime.  Keep throat moisturized and run humidifier to try to suppress cough. His CXR was OK sos that is reassuring.  Let schedule him for a breathing test next if not better.   Nani Gasseratherine Raneen Jaffer, MD

## 2017-02-28 MED ORDER — TRAMADOL HCL 50 MG PO TABS: 50.0000 mg | ORAL_TABLET | Freq: Three times a day (TID) | ORAL | 0 refills | Status: DC | PRN

## 2017-02-28 MED ORDER — ALBUTEROL SULFATE HFA 108 (90 BASE) MCG/ACT IN AERS: INHALATION_SPRAY | RESPIRATORY_TRACT | 1 refills | Status: DC

## 2017-02-28 NOTE — Addendum Note (Signed)
Addended by: Deno EtienneBARKLEY, Ulyses Panico L on: 02/28/2017 04:55 PM   Modules accepted: Orders

## 2017-02-28 NOTE — Telephone Encounter (Signed)
Pt stated that he would like something for rib pain to be sent to the pharmacy,and would like an inhaler to have an inhaler. He will  Try OTC nasonex or flonase. Will send to pharmacy .Loralee PacasBarkley, Irvan Tiedt [place]

## 2017-03-04 ENCOUNTER — Telehealth: Payer: Self-pay | Admitting: *Deleted

## 2017-03-04 NOTE — Telephone Encounter (Signed)
Pt lvm asking that a handicap placard be completed for him. His Email is [email or web address] or he can fax the form for us to complete.  rtn pt's call and lvm w/fax# for him to fax form.Albert PacasBarkley, Albert Blyden [place]

## 2017-03-05 NOTE — Telephone Encounter (Signed)
Form filled out. Will e

## 2017-03-06 ENCOUNTER — Telehealth: Payer: Self-pay | Admitting: *Deleted

## 2017-03-06 NOTE — Telephone Encounter (Signed)
Pre Authorization sent to cover my meds. [id number]

## 2017-03-07 NOTE — Telephone Encounter (Signed)
OK, hopefully he is feeling better. Please just let him know it was denied by his insurance.

## 2017-03-07 NOTE — Telephone Encounter (Signed)
Tramadol was denied. This was denied because one of the questions asked was was the patient going to be monitored regularly for opiod misuse. I made a note that this was just for acute pain and the patient would not even be using this for a month. However the still denied the medication

## 2017-03-12 NOTE — Telephone Encounter (Signed)
Message left n vm

## 2017-03-20 ENCOUNTER — Telehealth: Payer: Self-pay | Admitting: Family Medicine

## 2017-03-20 NOTE — Telephone Encounter (Signed)
Preventive  Call patient: I did get his 30 day cardiac event monitor results. It looks like some of his palpitations are actually associated with an arrhythmia called atrial fibrillation/atrial flutter. It looks like he is going in and out of the arrhythmia. He needs to schedule appointment with Dr. Alonza Bogus at the cardiology office. If he is Albert Humphrey of these results and Albert Humphrey has an appointment in perfect. If not then please let me know as we will need to start him on an anticoagulant.  Nani Gasser, MD

## 2017-03-20 NOTE — Telephone Encounter (Signed)
Left message to call office for results.

## 2017-03-20 NOTE — Telephone Encounter (Signed)
Pt has not had an appointment with Dr. Alonza Bogus for several months.  He stated that he is on Xarelto, and if you want to order an anticoagulant, that would be fine.  Please advise.

## 2017-03-21 NOTE — Telephone Encounter (Signed)
Okay, sounds good. I forgot that he was on the Xarelto. Do think since he still flipping in and out of A. fib it might be worth just following up Dr. Alonza Bogus to make sure that he doesn't recommend any changes. Not urgent but maybe in the next month or 2.

## 2017-03-21 NOTE — Telephone Encounter (Signed)
Left VM with instructions.

## 2017-06-04 ENCOUNTER — Telehealth: Payer: Self-pay | Admitting: Osteopathic Medicine

## 2017-06-04 DIAGNOSIS — M109 Gout, unspecified: Secondary | ICD-10-CM

## 2017-06-04 MED ORDER — INDOMETHACIN 50 MG PO CAPS: ORAL_CAPSULE | ORAL | 1 refills | Status: DC

## 2017-06-04 NOTE — Telephone Encounter (Signed)
Pt called clinic today stating he is having a GOUT flare. He was seen in office on 10/01/16 for the same thing. Questions if an Rx can be sent in for him. Routing to Provider in office for review (PCP out of office).

## 2017-06-04 NOTE — Telephone Encounter (Signed)
Left update on Pt's VM. Callback provided for any questions.  

## 2017-06-04 NOTE — Telephone Encounter (Signed)
I refilled the indomethacin for acute gout treatment that he should schedule follow-up with PCP to discuss chronic gout management/prevention, he should come see us or seek medical care at urgent care/ER if anything gets worse or changes

## 2017-07-05 ENCOUNTER — Telehealth: Payer: Self-pay | Admitting: Family Medicine

## 2017-07-05 DIAGNOSIS — Z1211 Encounter for screening for malignant neoplasm of colon: Secondary | ICD-10-CM

## 2017-07-05 NOTE — Telephone Encounter (Signed)
OK to place 

## 2017-07-05 NOTE — Telephone Encounter (Signed)
Patient would like a referral placed for a colonoscopy to the same place he was referred last time. Thanks!!

## 2017-07-08 NOTE — Addendum Note (Signed)
Addended by: Avon GullyMCCRIMMON, ANDREA C on: 07/08/2017 11:30 AM   Modules accepted: Orders

## 2017-07-09 ENCOUNTER — Encounter: Payer: Self-pay | Admitting: Family Medicine

## 2017-07-09 ENCOUNTER — Ambulatory Visit (INDEPENDENT_AMBULATORY_CARE_PROVIDER_SITE_OTHER): Payer: BC Managed Care – PPO | Admitting: Family Medicine

## 2017-07-09 DIAGNOSIS — M109 Gout, unspecified: Secondary | ICD-10-CM | POA: Insufficient documentation

## 2017-07-09 DIAGNOSIS — M10071 Idiopathic gout, right ankle and foot: Secondary | ICD-10-CM

## 2017-07-09 LAB — CBC
HCT: 43.4 % (ref 38.5–50.0)
Hemoglobin: 14.6 g/dL (ref 13.2–17.1)
MCH: 31.1 pg (ref 27.0–33.0)
MCHC: 33.6 g/dL (ref 32.0–36.0)
MCV: 92.3 fL (ref 80.0–100.0)
MPV: 9.4 fL (ref 7.5–12.5)
Platelets: 304 10*3/uL (ref 140–400)
RBC: 4.7 MIL/uL (ref 4.20–5.80)
RDW: 14.1 % (ref 11.0–15.0)
WBC: 9.7 10*3/uL (ref 3.8–10.8)

## 2017-07-09 LAB — COMPLETE METABOLIC PANEL WITH GFR
ALT: 31 U/L (ref 9–46)
AST: 24 U/L (ref 10–35)
Albumin: 4.1 g/dL (ref 3.6–5.1)
Alkaline Phosphatase: 72 U/L (ref 40–115)
BUN: 20 mg/dL (ref 7–25)
CO2: 24 mmol/L (ref 20–31)
Calcium: 9.6 mg/dL (ref 8.6–10.3)
Chloride: 101 mmol/L (ref 98–110)
Creat: 1.02 mg/dL (ref 0.70–1.33)
GFR, Est African American: >89 mL/min (ref >=60)
GFR, Est Non African American: 84 mL/min (ref >=60)
Glucose, Bld: 91 mg/dL (ref 65–99)
Potassium: 4.2 mmol/L (ref 3.5–5.3)
Sodium: 138 mmol/L (ref 135–146)
Total Bilirubin: 0.4 mg/dL (ref 0.2–1.2)
Total Protein: 7.2 g/dL (ref 6.1–8.1)

## 2017-07-09 MED ORDER — HYDROCODONE-ACETAMINOPHEN 5-325 MG PO TABS: 1.0000 | ORAL_TABLET | Freq: Four times a day (QID) | ORAL | 0 refills | Status: DC | PRN

## 2017-07-09 MED ORDER — COLCHICINE 0.6 MG PO TABS: 0.6000 mg | ORAL_TABLET | Freq: Every day | ORAL | 3 refills | Status: DC

## 2017-07-09 MED ORDER — ALLOPURINOL 300 MG PO TABS: 300.0000 mg | ORAL_TABLET | Freq: Every day | ORAL | 2 refills | Status: DC

## 2017-07-09 NOTE — Patient Instructions (Signed)
Thank you for coming in today. Use the boot as needed.  Take colchicine daily to stop gout attack Take allopurinol daily to lower uric acid and treat gout chronically.  Use Norco for severe pain right now.   Get labs today.   Recheck gout levels with Dr Linford ArnoldMetheney in 2-3 months.   Return sooner if needed.   Continue Atkins diet.    Allopurinol tablets What is this medicine? ALLOPURINOL (al oh PURE i nole) reduces the amount of uric acid the body makes. It is used to treat the symptoms of gout. It is also used to treat or prevent high uric acid levels that occur as a result of certain types of chemotherapy. This medicine may also help patients who frequently have kidney stones. This medicine may be used for other purposes; ask your health care provider or pharmacist if you have questions. COMMON BRAND NAME(S): Zyloprim What should I tell my health care provider before I take this medicine? They need to know if you have any of these conditions: -kidney or liver disease -an unusual or allergic reaction to allopurinol, other medicines, foods, dyes, or preservatives -pregnant or trying to get pregnant -breast feeding How should I use this medicine? Take this medicine by mouth with a glass of water. Follow the directions on the prescription label. If this medicine upsets your stomach, take it with food or milk. Take your doses at regular intervals. Do not take your medicine more often than directed. Talk to your pediatrician regarding the use of this medicine in children. Special care may be needed. While this drug may be prescribed for children as young as 6 years for selected conditions, precautions do apply. Overdosage: If you think you have taken too much of this medicine contact a poison control center or emergency room at once. NOTE: This medicine is only for you. Do not share this medicine with others. What if I miss a dose? If you miss a dose, take it as soon as you can. If it is  almost time for your next dose, take only that dose. Do not take double or extra doses. What may interact with this medicine? Do not take this medicine with the following medication: -didanosine, ddI This medicine may also interact with the following medications: -amoxicillin or ampicillin -azathioprine -certain medicines used to treat gout -certain types of diuretics -chlorpropamide -cyclosporine -dicumarol -mercaptopurine -tolbutamide -warfarin This list may not describe all possible interactions. Give your health care provider a list of all the medicines, herbs, non-prescription drugs, or dietary supplements you use. Also tell them if you smoke, drink alcohol, or use illegal drugs. Some items may interact with your medicine. What should I watch for while using this medicine? Visit your doctor or health care professional for regular checks on your progress. If you are taking this medicine to treat gout, you may not have less frequent attacks at first. Keep taking your medicine regularly and the attacks should get better within 2 to 6 weeks. Drink plenty of water (10 to 12 full glasses a day) while you are taking this medicine. This will help to reduce stomach upset and reduce the risk of getting gout or kidney stones. Call your doctor or health care professional at once if you get a skin rash together with chills, fever, sore throat, or nausea and vomiting, if you have blood in your urine, or difficulty passing urine. Do not take vitamin C without asking your doctor or health care professional. Too much vitamin C can  increase the chance of getting kidney stones. You may get drowsy or dizzy. Do not drive, use machinery, or do anything that needs mental alertness until you know how this drug affects you. Do not stand or sit up quickly, especially if you are an older patient. This reduces the risk of dizzy or fainting spells. Alcohol can make you more drowsy and dizzy. Alcohol can also increase  the chance of stomach problems and increase the amount of uric acid in your blood. Avoid alcoholic drinks. What side effects may I notice from receiving this medicine? Side effects that you should report to your doctor or health care professional as soon as possible: -allergic reactions like skin rash, itching or hives, swelling of the face, lips, or tongue -breathing problems -muscle aches or pains -redness, blistering, peeling or loosening of the skin, including inside the mouth Side effects that usually do not require medical attention (report to your doctor or health care professional if they continue or are bothersome): -changes in taste -diarrhea -indigestion -stomach pain or cramps This list may not describe all possible side effects. Call your doctor for medical advice about side effects. You may report side effects to FDA at 1-800-FDA-1088. Where should I keep my medicine? Keep out of the reach of children. Store at room temperature between 15 and 25 degrees C (59 and 77 degrees F). Protect from light and moisture. Throw away any unused medicine after the expiration date. NOTE: This sheet is a summary. It may not cover all possible information. If you have questions about this medicine, talk to your doctor, pharmacist, or health care provider.  2018 Elsevier/Gold Standard (2008-06-07 14:26:54)

## 2017-07-09 NOTE — Progress Notes (Signed)
Albert Humphrey is a 51 y.o. male who [facility] today for acute pain of the right great toe. Patient has a one-week history of severe pain in the right great toe with redness associated. He notes this is consistent with previous episodes of gout. He's tried taking leftover indomethacin which has not helped. He denies any injury. He denies any fevers or chills. He notes that he has been using an Atkin's diet for the last 2 months and has lost 30 pounds. He is reluctant to stop the Atkin's diet due to gout if possible.   Past Medical History:  Diagnosis Date  . Anxiety   . Atrial fibrillation (HCC) 05/04/2015  . 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 Dyslipidemia   . Hypertension   . Obesity    Past Surgical History:  Procedure Laterality Date  . APPENDECTOMY    . ectinartial colon resection     partial  . HERNIA REPAIR    . HIP SURGERY    . TOTAL HIP ARTHROPLASTY Right 04/20/2013   5/5/20Dr. Doristine Counteravid Howe   Social History  Substance Use Topics  . Smoking status: Former Smoker    Packs/day: 1.00    Years: 30.00    Types: Cigarettes    Start date: 07/24/2012  . Smokeless tobacco: Never Used  . Alcohol use 1.5 oz/week    3 Standard drinks or equivalent per week     Comment: per week   family history includes Alcohol abuse in his father; Heart attack in his other; Heart disease in his father; Hypertension in his father and mother; Stroke in his father.  ROS as above:  Medications: Current Outpatient Prescriptions  Medication Sig Dispense Refill  . metoprolol tartrate (LOPRESSOR) 25 MG tablet     . allopurinol (ZYLOPRIM) 300 MG tablet Take 1 tablet (300 mg total) by mouth daily. 90 tablet 2  . colchicine 0.6 MG tablet Take 1 tablet (0.6 mg total) by mouth daily. 30 tablet 3  . HYDROcodone-acetaminophen (NORCO/VICODIN) 5-325 MG tablet Take 1 tablet by mouth  every 6 (six) hours as needed. 15 tablet 0  . rivaroxaban (XARELTO) 20 MG TABS tablet Take 20 mg by mouth.     No current facility-administered medications for this visit.    Allergies  Allergen Reactions  . Pravastatin Other (See Comments)    Joint aches  . Simvastatin Other (See Comments)    Joint aches    Health Maintenance Health Maintenance  Topic Date Due  . Hepatitis C Screening  Dec 17, 1966  . HIV Screening  01/24/1979  . COLONOSCOPY  01/24/2014  . INFLUENZA VACCINE  07/17/2017  . TETANUS/TDAP  07/01/2022     Exam:  BP 116/77   Pulse 100   Wt (!) 308 lb (139.7 kg)   SpO2 96%   BMI 44.19 kg/m   Wt Readings from Last 10 Encounters:  07/09/17 (!) 308 lb (139.7 kg)  01/25/17 (!) 320 lb (145.2 kg)  10/02/16 (!) 316 lb (143.3 kg)  10/18/15 (!) 312 lb (141.5 kg)  06/04/14 275 lb (124.7 kg)  03/16/14 267 lb (121.1 kg)  12/08/13 267 lb (121.1 kg)  10/27/13 268 lb (121.6 kg)  10/02/13 272 lb (123.4 kg)  03/19/13 287 lb (130.2 kg)    Gen: Well NAD Right foot: Right first MTP is erythematous and tender to palpation. Motion limited by pain. Capillary refill and sensation intact distally.   No  results found for this or any previous visit (from the past 72 hour(s)). No results found.    Assessment and Plan: 51 y.o. male with acute gout flare. This likely was triggered by the relatively high protein Atkin's diet. However he is losing weight. Start both colchicine and allopurinol. Treat acute pain with Norco. Check uric acid CBC and metabolic panel today. Will use colchicine prophylactically for gout flares while starting allopurinol over the next 3 months. Recommend patient follow-up with PCP to recheck uric acid levels in about 3 months.  I think on average is probably better to stay on the Atkin's diet as he is doing well with it. This will temporarily increase his uric acid level but after a few months of this typically uric acid levels accommodate. I think overall  weight loss is better then an ultra low purine diet which usually is associated with weight gain.  I additionally recommended a cam walker boot to allow patient to walk more normally.  Orders Placed This Encounter  Procedures  . CBC  . COMPLETE METABOLIC PANEL WITH GFR  . Uric acid   Meds ordered this encounter  Medications  . colchicine 0.6 MG tablet    Sig: Take 1 tablet (0.6 mg total) by mouth daily.    Dispense:  30 tablet    Refill:  3  . allopurinol (ZYLOPRIM) 300 MG tablet    Sig: Take 1 tablet (300 mg total) by mouth daily.    Dispense:  90 tablet    Refill:  2  . HYDROcodone-acetaminophen (NORCO/VICODIN) 5-325 MG tablet    Sig: Take 1 tablet by mouth every 6 (six) hours as needed.    Dispense:  15 tablet    Refill:  0     Discussed warning signs or symptoms. Please see discharge instructions. Patient expresses understanding.  Patient researched [facility] Controlled Substance Reporting System.

## 2017-07-10 LAB — URIC ACID: Uric Acid, Serum: 9.9 mg/dL — ABNORMAL HIGH (ref 4.0–8.0)

## 2017-07-16 LAB — HM COLONOSCOPY

## 2017-07-19 ENCOUNTER — Encounter: Payer: Self-pay | Admitting: Family Medicine

## 2017-08-01 ENCOUNTER — Telehealth: Payer: Self-pay

## 2017-08-01 MED ORDER — HYDROCODONE-ACETAMINOPHEN 5-325 MG PO TABS: 1.0000 | ORAL_TABLET | Freq: Four times a day (QID) | ORAL | 0 refills | Status: DC | PRN

## 2017-08-01 NOTE — Telephone Encounter (Signed)
Left detailed vm with recommendations. Requested a call back with concerns.  

## 2017-08-01 NOTE — Telephone Encounter (Signed)
Albert HillDonald complains he now has a gout flare up on his left great toe. He is taking both medications daily. He would like an additional medication to help relieve the pain. Please advise.

## 2017-08-01 NOTE — Telephone Encounter (Signed)
Cut allopurinol in half.  Increase colchicine to twice daily for three days and then go back to once daily.  I have prescribed norco for temporary control of pain.  The prescription is ready for pick up.   If this keeps happening we may have to go to a lower protein diet for gout.

## 2017-10-10 ENCOUNTER — Telehealth: Payer: Self-pay | Admitting: Family Medicine

## 2017-10-10 NOTE — Telephone Encounter (Signed)
Let see if we can get him in the 950 slot or even at 8 AM slot.  If not then okay to use 1 of the acute tomorrow.

## 2017-10-10 NOTE — Telephone Encounter (Signed)
Patient called adv that he needs to be seen for his gout and get labs. Pt also wants to get back on depression med and tomorrow is his only day off because he is going to cardiologist also. Pt states he does not know when he will get another day off and really needs to see you. Can I use one of those acute slots for tomorrow. Please adv

## 2017-10-10 NOTE — Telephone Encounter (Signed)
Patient stated he will come at 8 am and said thank you so much for working him in. Thanks

## 2017-10-11 ENCOUNTER — Encounter: Payer: Self-pay | Admitting: Family Medicine

## 2017-10-11 ENCOUNTER — Ambulatory Visit (INDEPENDENT_AMBULATORY_CARE_PROVIDER_SITE_OTHER): Payer: BC Managed Care – PPO | Admitting: Family Medicine

## 2017-10-11 VITALS — BP 131/80 | HR 100 | Ht 70.0 in | Wt 312.0 lb

## 2017-10-11 DIAGNOSIS — Z13220 Encounter for screening for lipoid disorders: Secondary | ICD-10-CM

## 2017-10-11 DIAGNOSIS — M1A9XX0 Chronic gout, unspecified, without tophus (tophi): Secondary | ICD-10-CM | POA: Diagnosis not present

## 2017-10-11 DIAGNOSIS — F411 Generalized anxiety disorder: Secondary | ICD-10-CM

## 2017-10-11 DIAGNOSIS — Z6841 Body Mass Index (BMI) 40.0 and over, adult: Secondary | ICD-10-CM | POA: Diagnosis not present

## 2017-10-11 DIAGNOSIS — F339 Major depressive disorder, recurrent, unspecified: Secondary | ICD-10-CM

## 2017-10-11 DIAGNOSIS — R002 Palpitations: Secondary | ICD-10-CM | POA: Diagnosis not present

## 2017-10-11 MED ORDER — METOPROLOL TARTRATE 50 MG PO TABS: 50.0000 mg | ORAL_TABLET | Freq: Two times a day (BID) | ORAL | 0 refills | Status: DC

## 2017-10-11 MED ORDER — SERTRALINE HCL 50 MG PO TABS: ORAL_TABLET | ORAL | 1 refills | Status: DC

## 2017-10-11 NOTE — Progress Notes (Signed)
Subjective:    CC: Gout, depression   HPI: Her for f/u gout. Last flare was in July after starting the Atkins diet.  He was started on colchicine and allopurinol.    Would like to restart depression medication. Says he doesn't really feel that deptressed.  He has been feeling much more anxious.  Says celexa caused him to gain weight.   He's been having more frequent palpitations. He is Artie on metoprolol 25 mg it feels like it's really not working. He has been on the BorgW[employer]kins diet lately so he is not sure if that may be triggering it. 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  Obesity , Renato ShinMorbid-he is currently on the [employer] diet.  He did it for short period of time was actually able to lose 10 pounds but then count of came off of it around the time he had the gout flare.  He actually is planning to go back on it.  He says it is a version between FrederikaAdkins and ketogenic diet.  Past medical history, Surgical history, Family history not pertinant except as noted below, Social history, Allergies, and medications have been entered into the medical record, reviewed, and corrections made.   Review of Systems: No fevers, chills, night sweats, weight loss, chest pain, or shortness of breath.   Objective:    General: Well Developed, well nourished, and in no acute distress.  Neuro: Alert and oriented x3, extra-ocular muscles intact, sensation grossly intact.  HEENT: Normocephalic, atraumatic  Skin: Warm and dry, no rashes. Cardiac: Regular rate and rhythm, no murmurs rubs or gallops, no lower extremity edema.  Respiratory: Clear to auscultation bilaterally. Not using accessory muscles, speaking in full sentences.   Impression and Recommendations:    Gout - due to recheck uric acid levels.  We will adjust dose if needed on the allopurinol.  Will call with results once available.  BMI 44-continue to work  on healthy diet and regular exercise.  Currently he is on the Atkins diet.  Will check TSH as well.   Depression - PHQ 9 score of 20 today and GAD 7 score of 17 which is up significantly from previous.  Palpitations-we'll increase metoprolol to 50 mg and he will try to reschedule his cardiology appointment.

## 2017-10-12 LAB — CBC
HCT: 40.5 % (ref 38.5–50.0)
Hemoglobin: 14.1 g/dL (ref 13.2–17.1)
MCH: 31.1 pg (ref 27.0–33.0)
MCHC: 34.8 g/dL (ref 32.0–36.0)
MCV: 89.2 fL (ref 80.0–100.0)
MPV: 10 fL (ref 7.5–12.5)
Platelets: 227 10*3/uL (ref 140–400)
RBC: 4.54 10*6/uL (ref 4.20–5.80)
RDW: 14.9 % (ref 11.0–15.0)
WBC: 5.6 10*3/uL (ref 3.8–10.8)

## 2017-10-12 LAB — COMPLETE METABOLIC PANEL WITH GFR
AG Ratio: 1.6 (calc) (ref 1.0–2.5)
ALT: 32 U/L (ref 9–46)
AST: 25 U/L (ref 10–35)
Albumin: 4.2 g/dL (ref 3.6–5.1)
Alkaline phosphatase (APISO): 62 U/L (ref 40–115)
BUN: 15 mg/dL (ref 7–25)
CO2: 22 mmol/L (ref 20–32)
Calcium: 9.4 mg/dL (ref 8.6–10.3)
Chloride: 103 mmol/L (ref 98–110)
Creat: 0.84 mg/dL (ref 0.70–1.33)
GFR, Est African American: 116 mL/min/{1.73_m2} (ref >=60)
GFR, Est Non African American: 100 mL/min/{1.73_m2} (ref >=60)
Globulin: 2.6 g/dL (calc) (ref 1.9–3.7)
Glucose, Bld: 105 mg/dL (ref 65–139)
Potassium: 4.3 mmol/L (ref 3.5–5.3)
Sodium: 136 mmol/L (ref 135–146)
Total Bilirubin: 0.6 mg/dL (ref 0.2–1.2)
Total Protein: 6.8 g/dL (ref 6.1–8.1)

## 2017-10-12 LAB — LIPID PANEL W/REFLEX DIRECT LDL
Cholesterol: 208 mg/dL — ABNORMAL HIGH (ref <200)
HDL: 34 mg/dL — ABNORMAL LOW (ref >40)
LDL Cholesterol (Calc): 129 mg/dL (calc) — ABNORMAL HIGH
Non-HDL Cholesterol (Calc): 174 mg/dL (calc) — ABNORMAL HIGH (ref <130)
Total CHOL/HDL Ratio: 6.1 (calc) — ABNORMAL HIGH (ref <5.0)
Triglycerides: 314 mg/dL — ABNORMAL HIGH (ref <150)

## 2017-10-12 LAB — URIC ACID: Uric Acid, Serum: 6.3 mg/dL (ref 4.0–8.0)

## 2017-10-12 LAB — TSH: TSH: 0.84 mIU/L (ref 0.40–4.50)

## 2017-10-29 ENCOUNTER — Other Ambulatory Visit: Payer: Self-pay | Admitting: *Deleted

## 2017-10-29 DIAGNOSIS — M10071 Idiopathic gout, right ankle and foot: Secondary | ICD-10-CM

## 2017-10-29 MED ORDER — COLCHICINE 0.6 MG PO TABS: 0.6000 mg | ORAL_TABLET | Freq: Every day | ORAL | 0 refills | Status: DC

## 2017-11-21 ENCOUNTER — Encounter: Payer: Self-pay | Admitting: Family Medicine

## 2017-11-21 ENCOUNTER — Ambulatory Visit: Payer: BC Managed Care – PPO | Admitting: Family Medicine

## 2017-11-21 VITALS — BP 131/81 | HR 92 | Ht 70.0 in | Wt 323.0 lb

## 2017-11-21 DIAGNOSIS — F339 Major depressive disorder, recurrent, unspecified: Secondary | ICD-10-CM | POA: Diagnosis not present

## 2017-11-21 DIAGNOSIS — F411 Generalized anxiety disorder: Secondary | ICD-10-CM | POA: Diagnosis not present

## 2017-11-21 DIAGNOSIS — R635 Abnormal weight gain: Secondary | ICD-10-CM | POA: Diagnosis not present

## 2017-11-21 DIAGNOSIS — Z6841 Body Mass Index (BMI) 40.0 and over, adult: Secondary | ICD-10-CM

## 2017-11-21 MED ORDER — SERTRALINE HCL 100 MG PO TABS: 100.0000 mg | ORAL_TABLET | Freq: Every day | ORAL | 1 refills | Status: DC

## 2017-11-21 MED ORDER — LIRAGLUTIDE -WEIGHT MANAGEMENT 18 MG/3ML ~~LOC~~ SOPN: 0.6000 mg | PEN_INJECTOR | Freq: Every day | SUBCUTANEOUS | 0 refills | Status: DC

## 2017-11-21 NOTE — Progress Notes (Signed)
Subjective:    Patient ID: Albert Humphrey, male    DOB: 08/13/1966, 51 y.o.   MRN: [id number]  HPI Here for 5-week follow-up for mood.  When I last saw him in October he was complaining of anxiety symptoms.  We decided to go ahead and put him on Zoloft.  He was also on Atkins diet at the time to really work on losing some weight.  He says when he first started the Zoloft he noticed a great improvement but more recently has just felt a little bit more edgy.  He thinks he would like to go up on the medication.  Abnormal weight gain-he was on the Atkin's diet when I last saw him and had lost some weight but says that he fell off on sticking with the diet but he knows he needs to get back on it but thinks he will probably wait until after the holidays.  Is no longer exercising either.  Does have atrial fibrillation and is on Xarelto   Review of Systems  BP 131/81   Pulse 92   Ht 5\' 10"  (1.778 m)   Wt (!) 323 lb (146.5 kg)   SpO2 97%   BMI 46.35 kg/m     Allergies  Allergen Reactions  . Warfarin Sodium     Other reaction(s): Unknown  . Celexa [Citalopram Hydrobromide] Other (See Comments)    Weight gain  . Pravastatin Other (See Comments)    Joint aches  . Simvastatin Other (See Comments)    Joint aches    Past Medical History:  Diagnosis Date  . Anxiety   . Atrial fibrillation (HCC) 05/04/2015  . 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 Dyslipidemia   . Hypertension   . Obesity     Past Surgical History:  Procedure Laterality Date  . APPENDECTOMY    . ectinartial colon resection     partial  . HERNIA REPAIR    . HIP SURGERY    . TOTAL HIP ARTHROPLASTY Right 04/20/2013   5/5/20Dr. Doristine Counteravid Howe    Social History   Socioeconomic History  . Marital status: Single    Spouse name: Not on file  . Number of children: Not on file  . Years of education: Not on file  . Highest education level: Not on file  Social Needs  . Financial resource strain: Not on file  . Food insecurity  - worry: Not on file  . Food insecurity - inability: Not on file  . Transportation needs - medical: Not on file  . Transportation needs - non-medical: Not on file  Occupational History  . Occupation: Magazine features editorTeacher    Employer: [employer]  Tobacco Use  . Smoking status: Former Smoker    Packs/day: 1.00    Years: 30.00    Pack years: 30.00    Types: Cigarettes    Start date: 07/24/2012  . Smokeless tobacco: Never Used  Substance and Sexual Activity  . Alcohol use: Yes    Alcohol/week: 1.5 oz    Types: 3 Standard drinks or equivalent per week    Comment: per week  . Drug use: No  . Sexual activity: Not on file  Other Topics Concern  . Not on file  Social History Narrative   No regular exercise.  Large amt of caffeine.     Family History  Problem Relation Age of Onset  . Hypertension Mother   . Hypertension Father   . Alcohol abuse Father   .  Heart disease Father        CVA  . Stroke Father   . Heart attack Other     Outpatient Encounter Medications as of 11/21/2017  Medication Sig  . allopurinol (ZYLOPRIM) 300 MG tablet Take 1 tablet (300 mg total) by mouth daily.  . colchicine 0.6 MG tablet Take 1 tablet (0.6 mg total) daily by mouth.  . metoprolol tartrate (LOPRESSOR) 50 MG tablet Take 1 tablet (50 mg total) by mouth 2 (two) times daily.  . sertraline (ZOLOFT) 100 MG tablet Take 1 tablet (100 mg total) by mouth daily. 1/2 tab po QD x 6 days, the increase to whole tab daily  . XARELTO 20 MG TABS tablet Take 20 mg by mouth daily.  . [DISCONTINUED] sertraline (ZOLOFT) 50 MG tablet 1/2 tab po QD x 6 days, the increase to whole tab daily  . Liraglutide -Weight Management (SAXENDA) 18 MG/3ML SOPN Inject 0.6 mg into the skin daily. After one week increase to 1.2 mg QD, then after one week increase to 1.8 mg.   No facility-administered encounter medications on file as of 11/21/2017.          Objective:   Physical Exam  Constitutional: He is oriented to person, place,  and time. He appears well-developed and well-nourished.  HENT:  Head: Normocephalic and atraumatic.  Cardiovascular: Normal rate, regular rhythm and normal heart sounds.  Pulmonary/Chest: Effort normal and breath sounds normal.  Neurological: He is alert and oriented to person, place, and time.  Skin: Skin is warm and dry.  Psychiatric: He has a normal mood and affect. His behavior is normal.       Assessment & Plan:  Depression/anxiety - PHQ - 9 score of 20, down to 11 today.  Some significant improvement will increase Zoloft to 100 mg and then follow-up in about 4-6 weeks.  Abnormal weight gain/BMI 46-we discussed options.  He is up to 323 pounds today.  We discussed possibly even considering a medication in addition to diet and exercise. We discussed working on diet, exercise as well.  Will work on calorie counting when I see him back.  Stay away from stimulant type medications because he does have atrial fibrillation.  Current weight:  323 lbs Goal weight: 180 lbs.  Exercise: start with increased acitivity Diet: portion control Medication: start Saxenda.   Follow up: 4 weeks.    Time spent 25 min. > 50% spent counseling about mood and obesity and need for weight loss.

## 2017-11-29 ENCOUNTER — Telehealth: Payer: Self-pay

## 2017-11-29 NOTE — Telephone Encounter (Signed)
PA required for Saxenda. Sent through CoverMyMeds.

## 2017-12-02 ENCOUNTER — Other Ambulatory Visit: Payer: Self-pay | Admitting: Family Medicine

## 2017-12-02 NOTE — Telephone Encounter (Signed)
Already changed to 100mg 

## 2017-12-02 NOTE — Telephone Encounter (Signed)
Patient notified via v/m.

## 2017-12-02 NOTE — Telephone Encounter (Signed)
saxenda was denied

## 2017-12-02 NOTE — Telephone Encounter (Signed)
Please call patient and let him know that the Sexenda was denied.  He may want to call his insurance to see if they will cover any of the weight loss medications.  If they will then we can certainly try different one.

## 2017-12-02 NOTE — Telephone Encounter (Signed)
Please advise on medication refill...thanks  

## 2018-02-03 ENCOUNTER — Ambulatory Visit: Payer: BC Managed Care – PPO | Admitting: Family Medicine

## 2018-02-03 ENCOUNTER — Encounter: Payer: Self-pay | Admitting: Family Medicine

## 2018-02-03 VITALS — BP 140/86 | HR 91 | Ht 70.5 in | Wt 325.0 lb

## 2018-02-03 DIAGNOSIS — R05 Cough: Secondary | ICD-10-CM | POA: Diagnosis not present

## 2018-02-03 DIAGNOSIS — M1A0710 Idiopathic chronic gout, right ankle and foot, without tophus (tophi): Secondary | ICD-10-CM

## 2018-02-03 DIAGNOSIS — R059 Cough, unspecified: Secondary | ICD-10-CM

## 2018-02-03 MED ORDER — BENZONATATE 200 MG PO CAPS: 200.0000 mg | ORAL_CAPSULE | Freq: Three times a day (TID) | ORAL | 1 refills | Status: DC | PRN

## 2018-02-03 MED ORDER — GUAIFENESIN-CODEINE 100-10 MG/5ML PO SOLN: 5.0000 mL | Freq: Four times a day (QID) | ORAL | 0 refills | Status: DC | PRN

## 2018-02-03 NOTE — Patient Instructions (Addendum)
Thank you for coming in today. Take over the counter medicine for cough as needed.  Use cough drops with menthol as needed.  Use tessalon pearles during the day  Use codeine cough medicine at bedtime as needed.   Recheck as needed.   It is ok to work if you do not have a fever.    Cough, Adult A cough helps to clear your throat and lungs. A cough may last only 2-3 weeks (acute), or it may last longer than 8 weeks (chronic). Many different things can cause a cough. A cough may be a sign of an illness or another medical condition. Follow these instructions at home:  Pay attention to any changes in your cough.  Take medicines only as told by your doctor. ? If you were prescribed an antibiotic medicine, take it as told by your doctor. Do not stop taking it even if you start to feel better. ? Talk with your doctor before you try using a cough medicine.  Drink enough fluid to keep your pee (urine) clear or pale yellow.  If the air is dry, use a cold steam vaporizer or humidifier in your home.  Stay away from things that make you cough at work or at home.  If your cough is worse at night, try using extra pillows to raise your head up higher while you sleep.  Do not smoke, and try not to be around smoke. If you need help quitting, ask your doctor.  Do not have caffeine.  Do not drink alcohol.  Rest as needed. Contact a doctor if:  You have new problems (symptoms).  You cough up yellow fluid (pus).  Your cough does not get better after 2-3 weeks, or your cough gets worse.  Medicine does not help your cough and you are not sleeping well.  You have pain that gets worse or pain that is not helped with medicine.  You have a fever.  You are losing weight and you do not know why.  You have night sweats. Get help right away if:  You cough up blood.  You have trouble breathing.  Your heartbeat is very fast. This information is not intended to replace advice given to you by  your health care provider. Make sure you discuss any questions you have with your health care provider. Document Released: 08/16/2011 Document Revised: 05/10/2016 Document Reviewed: 02/09/2015 Elsevier Interactive Patient Education  [employer]2018 [employer] Inc[employer]

## 2018-02-03 NOTE — Progress Notes (Signed)
Albert Humphrey is a 52 y.o. male who presents to [facility]: Primary Care Sports Medicine today for cough.  Patient with 24 hour history of significant cough. Also complaining of post-nasal drip, sore throat, and "tickle" in chest. States that cough is constant in nature. He thinks it is likely due to post nasal drip. Patient does endorse mild headache and congestion, but no sinus pain. No fever, shortness of breath, or diarrhea increased from baseline. Patient unable to sleep last night due to severity of cough.   Patient works as Education officer, museum, so many sick contacts.  Additionally we discussed gout.  Albert Humphrey is doing well with no significant gout flares. Past Medical History:  Diagnosis Date  . Anxiety   . Atrial fibrillation (HCC) 05/04/2015  . 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 Dyslipidemia   . Hypertension   . Obesity    Past Surgical History:  Procedure Laterality Date  . APPENDECTOMY    . ectinartial colon resection     partial  . HERNIA REPAIR    . HIP SURGERY    . TOTAL HIP ARTHROPLASTY Right 04/20/2013   5/5/20Dr. Doristine Counter   Social History   Tobacco Use  . Smoking status: Former Smoker    Packs/day: 1.00    Years: 30.00    Pack years: 30.00    Types: Cigarettes    Start date: 07/24/2012  . Smokeless tobacco: Never Used  Substance Use Topics  . Alcohol use: Yes    Alcohol/week: 1.5 oz    Types: 3 Standard drinks or equivalent per week    Comment: per week   family history includes Alcohol abuse in his father; Heart attack in his other; Heart disease in his father; Hypertension in his father and mother; Stroke in his father.  ROS as above:  Medications: Current Outpatient Medications  Medication Sig Dispense Refill  . allopurinol (ZYLOPRIM) 300 MG tablet Take 1 tablet (300 mg total) by mouth daily. 90 tablet 2  . colchicine 0.6 MG tablet Take 1 tablet (0.6 mg  total) daily by mouth. 90 tablet 0  . metoprolol tartrate (LOPRESSOR) 50 MG tablet Take 1 tablet (50 mg total) by mouth 2 (two) times daily. 180 tablet 0  . sertraline (ZOLOFT) 100 MG tablet Take 1 tablet (100 mg total) by mouth daily. 1/2 tab po QD x 6 days, the increase to whole tab daily 90 tablet 1  . XARELTO 20 MG TABS tablet Take 20 mg by mouth daily.  4  . benzonatate (TESSALON) 200 MG capsule Take 1 capsule (200 mg total) by mouth 3 (three) times daily as needed for cough. 45 capsule 1  . guaiFENesin-codeine 100-10 MG/5ML syrup Take 5 mLs by mouth every 6 (six) hours as needed for cough. 120 mL 0   No current facility-administered medications for this visit.    Allergies  Allergen Reactions  . Warfarin Sodium     Other reaction(s): Unknown  . Celexa [Citalopram Hydrobromide] Other (See Comments)    Weight gain  . Pravastatin Other (See Comments)    Joint aches  . Simvastatin Other (See Comments)    Joint aches    Health Maintenance Health Maintenance  Topic Date Due  . Hepatitis C Screening  11-May-1966  . HIV Screening  01/24/1979  . TETANUS/TDAP  07/01/2022  . COLONOSCOPY  07/17/2027  . INFLUENZA VACCINE  Completed     Exam:  BP 140/86   Pulse  91   Ht 5' 10.5" (1.791 m)   Wt (!) 325 lb (147.4 kg)   BMI 45.97 kg/m  Gen: Well NAD HEENT: EOMI,  MMM. TM normal bilaterally. No sinus tenderness. Oropharynx erythematous but without exudate/petechiae  Lungs: Normal work of breathing. No crackles or wheezes bilaterally  Heart: RRR no MRG Abd: NABS, Soft. Nondistended, Nontender Exts: Brisk capillary refill, warm and well perfused.    No results found for this or any previous visit (from the past 72 hour(s)). No results found.  Assessment and Plan: 52 y.o. male with cough. Patient's presentation consistent with viral URI causing post nasal drip and subsequent cough. No concern for sinusitis or pneumonia at this time.   Patient will benefit from symptomatic  management. Patient should take benzonatate during the day and codeine based cough syrup at night for cough suppression. Patient should expect to improve in 7-10 days. Patient should return to follow up if experiences fever or worsening of symptoms, shortness of breath.   Gout doing well continue current management.  No orders of the defined types were placed in this encounter.  Meds ordered this encounter  Medications  . DISCONTD: guaiFENesin-codeine 100-10 MG/5ML syrup    Sig: Take 5 mLs by mouth every 6 (six) hours as needed for cough.    Dispense:  120 mL    Refill:  0  . benzonatate (TESSALON) 200 MG capsule    Sig: Take 1 capsule (200 mg total) by mouth 3 (three) times daily as needed for cough.    Dispense:  45 capsule    Refill:  1  . guaiFENesin-codeine 100-10 MG/5ML syrup    Sig: Take 5 mLs by mouth every 6 (six) hours as needed for cough.    Dispense:  120 mL    Refill:  0    Discussed warning signs or symptoms. Please see discharge instructions. Patient expresses understanding.

## 2018-02-07 ENCOUNTER — Other Ambulatory Visit: Payer: Self-pay | Admitting: Family Medicine

## 2018-02-07 DIAGNOSIS — M10071 Idiopathic gout, right ankle and foot: Secondary | ICD-10-CM

## 2018-04-01 ENCOUNTER — Other Ambulatory Visit: Payer: Self-pay | Admitting: Family Medicine

## 2018-04-01 DIAGNOSIS — M10071 Idiopathic gout, right ankle and foot: Secondary | ICD-10-CM

## 2018-04-13 ENCOUNTER — Encounter: Payer: Self-pay | Admitting: Family Medicine

## 2018-04-21 ENCOUNTER — Encounter: Payer: Self-pay | Admitting: Family Medicine

## 2018-04-21 ENCOUNTER — Ambulatory Visit: Payer: BC Managed Care – PPO | Admitting: Family Medicine

## 2018-04-21 VITALS — BP 125/85 | HR 76 | Ht 71.0 in | Wt 320.0 lb

## 2018-04-21 DIAGNOSIS — M1A0710 Idiopathic chronic gout, right ankle and foot, without tophus (tophi): Secondary | ICD-10-CM | POA: Diagnosis not present

## 2018-04-21 DIAGNOSIS — E785 Hyperlipidemia, unspecified: Secondary | ICD-10-CM | POA: Diagnosis not present

## 2018-04-21 DIAGNOSIS — E782 Mixed hyperlipidemia: Secondary | ICD-10-CM

## 2018-04-21 DIAGNOSIS — I4891 Unspecified atrial fibrillation: Secondary | ICD-10-CM

## 2018-04-21 LAB — BASIC METABOLIC PANEL WITH GFR
BUN: 11 mg/dL (ref 7–25)
CO2: 26 mmol/L (ref 20–32)
Calcium: 9.7 mg/dL (ref 8.6–10.3)
Chloride: 105 mmol/L (ref 98–110)
Creat: 0.96 mg/dL (ref 0.70–1.33)
GFR, Est African American: 103 mL/min/{1.73_m2} (ref >=60)
GFR, Est Non African American: 89 mL/min/{1.73_m2} (ref >=60)
Glucose, Bld: 107 mg/dL — ABNORMAL HIGH (ref 65–99)
Potassium: 4.8 mmol/L (ref 3.5–5.3)
Sodium: 138 mmol/L (ref 135–146)

## 2018-04-21 LAB — LIPID PANEL
Cholesterol: 265 mg/dL — ABNORMAL HIGH (ref <200)
HDL: 41 mg/dL (ref >40)
LDL Cholesterol (Calc): 165 mg/dL (calc) — ABNORMAL HIGH
Non-HDL Cholesterol (Calc): 224 mg/dL (calc) — ABNORMAL HIGH (ref <130)
Total CHOL/HDL Ratio: 6.5 (calc) — ABNORMAL HIGH (ref <5.0)
Triglycerides: 348 mg/dL — ABNORMAL HIGH (ref <150)

## 2018-04-21 LAB — URIC ACID: Uric Acid, Serum: 6.7 mg/dL (ref 4.0–8.0)

## 2018-04-21 MED ORDER — SERTRALINE HCL 100 MG PO TABS: 150.0000 mg | ORAL_TABLET | Freq: Every day | ORAL | 1 refills | Status: DC

## 2018-04-21 MED ORDER — METOPROLOL TARTRATE 50 MG PO TABS: 50.0000 mg | ORAL_TABLET | Freq: Two times a day (BID) | ORAL | 0 refills | Status: DC

## 2018-04-21 MED ORDER — METFORMIN HCL 500 MG PO TABS: 500.0000 mg | ORAL_TABLET | Freq: Two times a day (BID) | ORAL | 1 refills | Status: DC

## 2018-04-21 NOTE — Progress Notes (Signed)
Subjective:    CC: Gout, mood, lipids  HPI:  52 year old male is here today to follow-up on gout-he is currently on allopurinol 300 mg as well as Colcrys.  Depression anxiety-on Zoloft 100 mg daily and tolerating well without any side effects or problems.  He went to discuss possibly increasing his dose of his Zoloft today. 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     Hyperlipidemia-not currently on a statin. Hasn't been able to exercise to his max bc of his afib.    Follow-up atrial fibrillation-he is actually taking 50 mg of metoprolol twice a day as well as his Xarelto 20 mg daily.  He still gets fairly symptomatic and gets short of breath and low energy with his A. fib.  In fact he recently tried to have an ablation done but it was not successful.     Past medical history, Surgical history, Family history not pertinant except as noted below, Social history, Allergies, and medications have been entered into the medical record, reviewed, and corrections made.   Review of Systems: No fevers, chills, night sweats, weight loss, chest pain, or shortness of breath.   Objective:    General: Well Developed, well nourished, and in no acute distress.  Neuro: Alert and oriented x3, extra-ocular muscles intact, sensation grossly intact.  HEENT: Normocephalic, atraumatic  Skin: Warm and dry, no rashes. Cardiac: Regular rate and rhythm, no murmurs rubs or gallops, no lower extremity edema.  Respiratory: Clear to auscultation bilaterally. Not using accessory muscles, speaking in full sentences.   Impression and Recommendations:    Gout -due to recheck uric acid level.  Taking his allopurinol regularly and has not had any recent flares or exacerbations.  Hyperlipidemia-due to recheck lipid panel.  Last levels were elevated.  Depression/anxiety-okay to increase Zoloft to 150 mg daily.  Call if any problems or concerns.  He really feels like a lot of his stress is over the last  month or so but is also the end of the school year and this tends to be a stressful time for him.  Atrial fibrillation- Continue with beta-blocker and Xarelto.  Unfortunately he still fairly symptomatic.  Follow-up with cardiology this summer.

## 2018-04-23 ENCOUNTER — Other Ambulatory Visit: Payer: Self-pay | Admitting: Family Medicine

## 2018-04-23 MED ORDER — PITAVASTATIN CALCIUM 2 MG PO TABS: 2.0000 mg | ORAL_TABLET | ORAL | 0 refills | Status: DC

## 2018-04-23 NOTE — Addendum Note (Signed)
Addended by: Nani Gasser D on: 04/23/2018 03:29 PM   Modules accepted: Orders

## 2018-04-24 ENCOUNTER — Other Ambulatory Visit: Payer: Self-pay | Admitting: Family Medicine

## 2018-04-25 ENCOUNTER — Other Ambulatory Visit: Payer: Self-pay | Admitting: Family Medicine

## 2018-04-28 ENCOUNTER — Telehealth: Payer: Self-pay

## 2018-04-28 NOTE — Telephone Encounter (Signed)
Gracyn called and left a message stating the directions to the Zoloft is incorrect. He states it should be 1.5 tablets daily. It does read that way but then it also states to take 1/2 tablet OD  X 6 days, then increase to whole tablet daily.

## 2018-04-29 MED ORDER — SERTRALINE HCL 100 MG PO TABS: 150.0000 mg | ORAL_TABLET | Freq: Every day | ORAL | 1 refills | Status: DC

## 2018-04-29 NOTE — Telephone Encounter (Signed)
Please resend and take off the 1/2 tab thing.  It should be 1.5 tabs daily

## 2018-04-29 NOTE — Telephone Encounter (Signed)
Sig updated and sent. Left VM to update Pt.

## 2018-05-06 ENCOUNTER — Other Ambulatory Visit: Payer: Self-pay | Admitting: Family Medicine

## 2018-05-06 DIAGNOSIS — M10071 Idiopathic gout, right ankle and foot: Secondary | ICD-10-CM

## 2018-08-09 ENCOUNTER — Other Ambulatory Visit: Payer: Self-pay | Admitting: Family Medicine

## 2018-08-15 ENCOUNTER — Ambulatory Visit: Payer: BC Managed Care – PPO | Admitting: Family Medicine

## 2018-08-15 ENCOUNTER — Encounter: Payer: Self-pay | Admitting: Family Medicine

## 2018-08-15 VITALS — BP 117/71 | HR 94 | Ht 71.0 in | Wt 313.0 lb

## 2018-08-15 DIAGNOSIS — R7301 Impaired fasting glucose: Secondary | ICD-10-CM | POA: Diagnosis not present

## 2018-08-15 DIAGNOSIS — R102 Pelvic and perineal pain: Secondary | ICD-10-CM

## 2018-08-15 DIAGNOSIS — I4891 Unspecified atrial fibrillation: Secondary | ICD-10-CM

## 2018-08-15 DIAGNOSIS — R7309 Other abnormal glucose: Secondary | ICD-10-CM | POA: Diagnosis not present

## 2018-08-15 DIAGNOSIS — R195 Other fecal abnormalities: Secondary | ICD-10-CM

## 2018-08-15 DIAGNOSIS — E785 Hyperlipidemia, unspecified: Secondary | ICD-10-CM

## 2018-08-15 DIAGNOSIS — R1032 Left lower quadrant pain: Secondary | ICD-10-CM

## 2018-08-15 DIAGNOSIS — Z23 Encounter for immunization: Secondary | ICD-10-CM

## 2018-08-15 MED ORDER — AMOXICILLIN-POT CLAVULANATE 500-125 MG PO TABS: 1.0000 | ORAL_TABLET | Freq: Three times a day (TID) | ORAL | 0 refills | Status: AC

## 2018-08-15 MED ORDER — METOPROLOL TARTRATE 50 MG PO TABS
50.0000 mg | ORAL_TABLET | Freq: Two times a day (BID) | ORAL | 1 refills | Status: DC
Start: 2018-08-15 — End: 2019-09-21

## 2018-08-15 MED ORDER — PITAVASTATIN CALCIUM 2 MG PO TABS: 2.0000 mg | ORAL_TABLET | ORAL | 3 refills | Status: DC

## 2018-08-15 MED ORDER — XARELTO 20 MG PO TABS: 20.0000 mg | ORAL_TABLET | Freq: Every day | ORAL | 3 refills | Status: DC

## 2018-08-15 NOTE — Progress Notes (Signed)
Subjective:    Patient ID: Albert Humphrey, male    DOB: September 20, 1966, 52 y.o.   MRN: [id number]  HPI 23-year-old male comes in today complaining of lower abdominal pain mostly over the suprapubic area.  He says for about 4 days now he has had this intense cramping pain that then lets up.  It was happening almost every hour and was quite intense.  But today he says is the most about 60% better.  He still had about 4 episodes of more intense pain but it just seems like it is not nearly as intense and is not nearly as frequent.  He has been on a keto diet to lose weight and so has been struggling some with his bowel movements.  He says that either tend to be more hard or constipated or more loose.  He denies any fever chills or sweats.  He denies any blood in the stool.  He says he has had a history of diverticulitis that he was hospitalized 4 years ago.  He says that he just feels like there is a lot of pressure when he urinates and a lot of pressure when he has a bowel movement.  But no dysuria or burning.  No blood in the urine.  Hyperlipidemia-he is been tolerating Livalo well without any side effects or problems.  He is due for repeat lipids.  Review of Systems  BP 117/71   Pulse 94   Ht 5\' 11"  (1.803 m)   Wt (!) 313 lb (142 kg)   SpO2 97%   BMI 43.65 kg/m     Allergies  Allergen Reactions  . Warfarin Sodium     Other reaction(s): Unknown  . Celexa [Citalopram Hydrobromide] Other (See Comments)    Weight gain  . Pravastatin Other (See Comments)    Joint aches  . Simvastatin Other (See Comments)    Joint aches    Past Medical History:  Diagnosis Date  . Anxiety   . Atrial fibrillation (HCC) 05/04/2015  . 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 Dyslipidemia   . Hypertension   . Obesity     Past Surgical History:  Procedure Laterality Date  . APPENDECTOMY    . ectinartial colon resection     partial  . HERNIA REPAIR    . HIP SURGERY    . TOTAL HIP ARTHROPLASTY Right 04/20/2013   5/5/20Dr. Doristine Counter    Social History   Socioeconomic History  . Marital status: Single    Spouse name: Not on file  . Number of children: Not on file  . Years of education: Not on file  . Highest education level: Not on file  Occupational History  . 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 Financial resource strain: Not on file  . Food insecurity:    Worry: Not on file    Inability: Not on file  . Transportation needs:    Medical: Not on file    Non-medical: Not on file  Tobacco Use  . Smoking status: Former Smoker    Packs/day: 1.00    Years: 30.00    Pack years: 30.00    Types: Cigarettes    Start date: 07/24/2012  . Smokeless tobacco: Never Used  Substance and Sexual Activity  . Alcohol use: Yes    Alcohol/week: 3.0 standard drinks    Types: 3 Standard drinks or equivalent per week    Comment: per week  .  Drug use: No  . Sexual activity: Not on file  Lifestyle  . Physical activity:    Days per week: Not on file    Minutes per session: Not on file  . Stress: Not on file  Relationships  . Social connections:    Talks on phone: Not on file    Gets together: Not on file    Attends religious service: Not on file    Active member of club or organization: Not on file    Attends meetings of clubs or organizations: Not on file    Relationship status: Not on file  . Intimate partner violence:    Fear of current or ex partner: Not on file    Emotionally abused: Not on file    Physically abused: Not on file    Forced sexual activity: Not on file  Other Topics Concern  . Not on file  Social History Narrative   No regular exercise.  Large amt of caffeine.     Family History  Problem Relation Age of Onset  . Hypertension Mother   . Hypertension Father   . Alcohol abuse Father   . Heart disease Father        CVA  . Stroke Father   . Heart attack Other     Outpatient Encounter Medications as of 08/15/2018  Medication Sig  .  allopurinol (ZYLOPRIM) 300 MG tablet TAKE 1 TABLET DAILY  . amoxicillin-clavulanate (AUGMENTIN) 500-125 MG tablet Take 1 tablet (500 mg total) by mouth 3 (three) times daily for 7 days.  . COLCRYS 0.6 MG tablet TAKE 1 TABLET (0.6 MG TOTAL) DAILY BY MOUTH.  . metFORMIN (GLUCOPHAGE) 500 MG tablet TAKE 1 TABLET (500 MG TOTAL) BY MOUTH 2 (TWO) TIMES DAILY WITH A MEAL.  . metoprolol tartrate (LOPRESSOR) 50 MG tablet Take 1 tablet (50 mg total) by mouth 2 (two) times daily.  . Pitavastatin Calcium 2 MG TABS Take 1 tablet (2 mg total) by mouth every other day. At bedtime.  . sertraline (ZOLOFT) 100 MG tablet Take 1.5 tablets (150 mg total) by mouth daily.  Carlena Hurl 20 MG TABS tablet Take 1 tablet (20 mg total) by mouth daily.  . [DISCONTINUED] metoprolol tartrate (LOPRESSOR) 50 MG tablet Take 1 tablet (50 mg total) by mouth 2 (two) times daily.  . [DISCONTINUED] Pitavastatin Calcium 2 MG TABS Take 1 tablet (2 mg total) by mouth every other day. At bedtime.  . [DISCONTINUED] XARELTO 20 MG TABS tablet Take 20 mg by mouth daily.   No facility-administered encounter medications on file as of 08/15/2018.         Objective:   Physical Exam  Constitutional: He is oriented to person, place, and time. He appears well-developed and well-nourished.  HENT:  Head: Normocephalic and atraumatic.  Cardiovascular: Normal rate, regular rhythm and normal heart sounds.  Pulmonary/Chest: Effort normal and breath sounds normal.  Abdominal: Soft. Bowel sounds are normal. He exhibits no distension and no mass. There is tenderness. There is no rebound and no guarding.  Neurological: He is alert and oriented to person, place, and time.  Skin: Skin is warm and dry.  Psychiatric: He has a normal mood and affect. His behavior is normal.        Assessment & Plan:  LLQ tenderness -even though he is complaining more of suprapubic tenderness on exam he is actually more tender in the left lower quadrant.  Concerning for  possible diverticulitis though he actually feels about 60% better today  than he has in the last several days with.  I am still going to go ahead and treat him preemptively but will get a CBC with differential since we are getting had ready to head into a holiday weekend.  Sent prescription for Augmentin.  Call on-call nurse over the weekend if not improving or getting worse.  If his pain becomes severe or he is runs a fever then he is to go the emergency department.  Suprapubic tenderness -check a urine sample as well.  Change in stools-likely secondary to changes to the keto diet.  Hyperipidemia-due to recheck lipid panel.  Will call with results once available.  So far he is been tolerating Livalo well.

## 2018-08-16 LAB — COMPLETE METABOLIC PANEL WITH GFR
AG Ratio: 1.4 (calc) (ref 1.0–2.5)
ALT: 18 U/L (ref 9–46)
AST: 20 U/L (ref 10–35)
Albumin: 4.1 g/dL (ref 3.6–5.1)
Alkaline phosphatase (APISO): 70 U/L (ref 40–115)
BUN: 16 mg/dL (ref 7–25)
CO2: 22 mmol/L (ref 20–32)
Calcium: 9.2 mg/dL (ref 8.6–10.3)
Chloride: 105 mmol/L (ref 98–110)
Creat: 0.83 mg/dL (ref 0.70–1.33)
GFR, Est African American: 116 mL/min/{1.73_m2} (ref >=60)
GFR, Est Non African American: 100 mL/min/{1.73_m2} (ref >=60)
Globulin: 2.9 g/dL (calc) (ref 1.9–3.7)
Glucose, Bld: 80 mg/dL (ref 65–139)
Potassium: 4 mmol/L (ref 3.5–5.3)
Sodium: 138 mmol/L (ref 135–146)
Total Bilirubin: 0.4 mg/dL (ref 0.2–1.2)
Total Protein: 7 g/dL (ref 6.1–8.1)

## 2018-08-16 LAB — CBC WITH DIFFERENTIAL/PLATELET
Basophils Absolute: 90 cells/uL (ref 0–200)
Basophils Relative: 1 %
Eosinophils Absolute: 180 cells/uL (ref 15–500)
Eosinophils Relative: 2 %
HCT: 39.4 % (ref 38.5–50.0)
Hemoglobin: 13.7 g/dL (ref 13.2–17.1)
Lymphs Abs: 2592 cells/uL (ref 850–3900)
MCH: 31.9 pg (ref 27.0–33.0)
MCHC: 34.8 g/dL (ref 32.0–36.0)
MCV: 91.8 fL (ref 80.0–100.0)
MPV: 9.8 fL (ref 7.5–12.5)
Monocytes Relative: 10.8 %
Neutro Abs: 5166 cells/uL (ref 1500–7800)
Neutrophils Relative %: 57.4 %
Platelets: 268 10*3/uL (ref 140–400)
RBC: 4.29 10*6/uL (ref 4.20–5.80)
RDW: 13.4 % (ref 11.0–15.0)
Total Lymphocyte: 28.8 %
WBC mixed population: 972 cells/uL — ABNORMAL HIGH (ref 200–950)
WBC: 9 10*3/uL (ref 3.8–10.8)

## 2018-08-16 LAB — LIPID PANEL
Cholesterol: 219 mg/dL — ABNORMAL HIGH (ref <200)
HDL: 38 mg/dL — ABNORMAL LOW (ref >40)
LDL Cholesterol (Calc): 149 mg/dL (calc) — ABNORMAL HIGH
Non-HDL Cholesterol (Calc): 181 mg/dL (calc) — ABNORMAL HIGH (ref <130)
Total CHOL/HDL Ratio: 5.8 (calc) — ABNORMAL HIGH (ref <5.0)
Triglycerides: 183 mg/dL — ABNORMAL HIGH (ref <150)

## 2018-08-16 LAB — URINALYSIS, ROUTINE W REFLEX MICROSCOPIC
Bilirubin Urine: NEGATIVE
Glucose, UA: NEGATIVE
Hgb urine dipstick: NEGATIVE
Leukocytes, UA: NEGATIVE
Nitrite: NEGATIVE
Protein, ur: NEGATIVE
Specific Gravity, Urine: 1.02 (ref 1.001–1.03)
pH: <=5 (ref 5.0–8.0)

## 2018-08-16 LAB — PSA: PSA: 0.6 ng/mL (ref <=4.0)

## 2018-08-16 LAB — HEMOGLOBIN A1C
Hgb A1c MFr Bld: 5.7 % of total Hgb — ABNORMAL HIGH (ref <5.7)
Mean Plasma Glucose: 117 (calc)
eAG (mmol/L): 6.5 (calc)

## 2018-10-01 ENCOUNTER — Other Ambulatory Visit: Payer: Self-pay | Admitting: Family Medicine

## 2018-11-06 ENCOUNTER — Ambulatory Visit: Payer: BC Managed Care – PPO | Admitting: Family Medicine

## 2018-12-02 ENCOUNTER — Telehealth: Payer: Self-pay

## 2018-12-02 NOTE — Telephone Encounter (Signed)
Please tell him that he really needs to come in.  This is a little bit more complicated than just a simple sinus infection.  There is always concern for abscess etc. so that is why he has to be examined and his vitals have to be checked.  We can certainly work him in on to another provider schedule if there is no/limited availability.

## 2018-12-02 NOTE — Telephone Encounter (Signed)
Albert Humphrey called and states he has LLQ pain. Denies fever, chills or sweats. He states he has a diverticulitis flare up. He would like an antibiotic sent in. I advised patient he needs to be evaluated. He states he has are hard time getting off work. He wanted me to ask Dr Linford ArnoldMetheney if she could send in the medication.

## 2018-12-03 NOTE — Telephone Encounter (Signed)
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 States he feels better today and will wait. Wanted to tell you thank you so much for suggesting that, but he will wait.

## 2018-12-03 NOTE — Telephone Encounter (Signed)
Can he come in before 8 AM?  I am usually here by about 715 if he wants to come in early

## 2018-12-03 NOTE — Telephone Encounter (Signed)
Left VM for Pt to return clinic call.  

## 2018-12-03 NOTE — Telephone Encounter (Signed)
Patient states that he can not come in tomorrow. He has a hard time coming in because of school. It hurts less today and he will try to bare it out and possibly come in after his [place] trip. I did offer him appointment but nothing works with his schedule he said.

## 2018-12-12 ENCOUNTER — Other Ambulatory Visit: Payer: Self-pay | Admitting: Family Medicine

## 2018-12-12 DIAGNOSIS — M10071 Idiopathic gout, right ankle and foot: Secondary | ICD-10-CM

## 2018-12-26 ENCOUNTER — Other Ambulatory Visit: Payer: Self-pay | Admitting: Family Medicine

## 2018-12-26 DIAGNOSIS — M10071 Idiopathic gout, right ankle and foot: Secondary | ICD-10-CM

## 2019-01-05 ENCOUNTER — Other Ambulatory Visit: Payer: Self-pay | Admitting: Family Medicine

## 2019-01-05 DIAGNOSIS — M10071 Idiopathic gout, right ankle and foot: Secondary | ICD-10-CM

## 2019-01-29 ENCOUNTER — Other Ambulatory Visit: Payer: Self-pay | Admitting: Family Medicine

## 2019-01-29 DIAGNOSIS — M10071 Idiopathic gout, right ankle and foot: Secondary | ICD-10-CM

## 2019-02-05 ENCOUNTER — Other Ambulatory Visit: Payer: Self-pay | Admitting: Family Medicine

## 2019-02-05 ENCOUNTER — Encounter: Payer: Self-pay | Admitting: Family Medicine

## 2019-02-05 ENCOUNTER — Ambulatory Visit: Payer: BC Managed Care – PPO | Admitting: Family Medicine

## 2019-02-05 VITALS — BP 133/74 | HR 63 | Temp 98.7°F | Ht 71.0 in | Wt 295.0 lb

## 2019-02-05 DIAGNOSIS — R059 Cough, unspecified: Secondary | ICD-10-CM

## 2019-02-05 DIAGNOSIS — R05 Cough: Secondary | ICD-10-CM

## 2019-02-05 DIAGNOSIS — R7309 Other abnormal glucose: Secondary | ICD-10-CM | POA: Diagnosis not present

## 2019-02-05 DIAGNOSIS — J069 Acute upper respiratory infection, unspecified: Secondary | ICD-10-CM | POA: Diagnosis not present

## 2019-02-05 DIAGNOSIS — M1A0710 Idiopathic chronic gout, right ankle and foot, without tophus (tophi): Secondary | ICD-10-CM

## 2019-02-05 DIAGNOSIS — E785 Hyperlipidemia, unspecified: Secondary | ICD-10-CM

## 2019-02-05 LAB — POCT INFLUENZA A/B
Influenza A, POC: NEGATIVE
Influenza B, POC: NEGATIVE

## 2019-02-05 MED ORDER — HYDROCODONE-HOMATROPINE 5-1.5 MG/5ML PO SYRP: 5.0000 mL | ORAL_SOLUTION | Freq: Every evening | ORAL | 0 refills | Status: DC | PRN

## 2019-02-05 MED ORDER — BENZONATATE 200 MG PO CAPS: 200.0000 mg | ORAL_CAPSULE | Freq: Two times a day (BID) | ORAL | 0 refills | Status: DC | PRN

## 2019-02-05 NOTE — Patient Instructions (Signed)

## 2019-02-05 NOTE — Progress Notes (Signed)
Acute Office Visit  Subjective:    Patient ID: Albert Humphrey, male    DOB: Dec 07, 1966, 53 y.o.   MRN: [id number]  Chief Complaint  Patient presents with  . Cough    HPI Patient is in today for cough and cold symptoms that started yesterday.  He says initially he woke up with a sore throat and then it turned into chest congestion.  He said the cough is quite severe that it actually burns his chest.  He is noticing a lot of wheezing.  No fevers or chills but has had diffuse body aches.  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  Past Medical History:  Diagnosis Date  . Anxiety   . Atrial fibrillation (HCC) 05/04/2015  . 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 Dyslipidemia   . Hypertension   . Obesity     Past Surgical History:  Procedure Laterality Date  . APPENDECTOMY    . ectinartial colon resection     partial  . HERNIA REPAIR    . HIP SURGERY    . TOTAL HIP ARTHROPLASTY Right 04/20/2013   5/5/20Dr. Doristine Counter    Family History  Problem Relation Age of Onset  . Hypertension Mother   . Hypertension Father   . Alcohol abuse Father   . Heart disease Father        CVA  . Stroke Father   . Heart attack Other     Social History   Socioeconomic History  . Marital status: Single    Spouse name: Not on file  . Number of children: Not on file  . Years of education: Not on file  . Highest education level: Not on file  Occupational History  . 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 Financial resource strain: Not on file  . Food insecurity:    Worry: Not on file    Inability: Not on file  . Transportation needs:    Medical: Not on file    Non-medical: Not on file  Tobacco Use  . Smoking status: Former Smoker    Packs/day: 1.00    Years: 30.00    Pack years: 30.00    Types: Cigarettes    Start date: 07/24/2012  . Smokeless tobacco: Never Used  Substance and Sexual Activity  . Alcohol use: Yes    Alcohol/week: 3.0  standard drinks    Types: 3 Standard drinks or equivalent per week    Comment: per week  . Drug use: No  . Sexual activity: Not on file  Lifestyle  . Physical activity:    Days per week: Not on file    Minutes per session: Not on file  . Stress: Not on file  Relationships  . Social connections:    Talks on phone: Not on file    Gets together: Not on file    Attends religious service: Not on file    Active member of club or organization: Not on file    Attends meetings of clubs or organizations: Not on file    Relationship status: Not on file  . Intimate partner violence:    Fear of current or ex partner: Not on file    Emotionally abused: Not on file    Physically abused: Not on file    Forced sexual activity: Not on file  Other Topics Concern  . Not on file  Social History Narrative  No regular exercise.  Large amt of caffeine.     Outpatient Medications Prior to Visit  Medication Sig Dispense Refill  . allopurinol (ZYLOPRIM) 300 MG tablet TAKE 1 TABLET BY MOUTH EVERY DAY 90 tablet 2  . colchicine 0.6 MG tablet Take 1 tablet (0.6 mg total) by mouth daily. LAST REFILL. 30 tablet 0  . metFORMIN (GLUCOPHAGE) 500 MG tablet TAKE 1 TABLET (500 MG TOTAL) BY MOUTH 2 (TWO) TIMES DAILY WITH A MEAL. 180 tablet 1  . metoprolol tartrate (LOPRESSOR) 50 MG tablet Take 1 tablet (50 mg total) by mouth 2 (two) times daily. 180 tablet 1  . Pitavastatin Calcium 2 MG TABS Take 1 tablet (2 mg total) by mouth every other day. At bedtime. 45 tablet 3  . sertraline (ZOLOFT) 100 MG tablet Take 2 tablets (200 mg total) by mouth daily. 180 tablet 1  . XARELTO 20 MG TABS tablet Take 1 tablet (20 mg total) by mouth daily. 90 tablet 3   No facility-administered medications prior to visit.     Allergies  Allergen Reactions  . Warfarin Sodium     Other reaction(s): Unknown  . Celexa [Citalopram Hydrobromide] Other (See Comments)    Weight gain  . Pravastatin Other (See Comments)    Joint aches  .  Simvastatin Other (See Comments)    Joint aches    ROS     Objective:    Physical Exam  Constitutional: He is oriented to person, place, and time. He appears well-developed and well-nourished.  HENT:  Head: Normocephalic and atraumatic.  Right Ear: External ear normal.  Left Ear: External ear normal.  Nose: Nose normal.  Mouth/Throat: Oropharynx is clear and moist.  TMs and canals are clear.   Eyes: Pupils are equal, round, and reactive to light. Conjunctivae and EOM are normal.  Neck: Neck supple. No thyromegaly present.  Cardiovascular: Normal rate and normal heart sounds.  Pulmonary/Chest: Effort normal. He has wheezes.  + diffuse wheezing  Lymphadenopathy:    He has no cervical adenopathy.  Neurological: He is alert and oriented to person, place, and time.  Skin: Skin is warm and dry.  Psychiatric: He has a normal mood and affect.    BP 133/74   Pulse 63   Temp 98.7 F (37.1 C) (Oral)   Ht 5\' 11"  (1.803 m)   Wt 295 lb (133.8 kg)   SpO2 99%   BMI 41.14 kg/m  Wt Readings from Last 3 Encounters:  02/05/19 295 lb (133.8 kg)  08/15/18 (!) 313 lb (142 kg)  04/21/18 (!) 320 lb (145.2 kg)    Health Maintenance Due  Topic Date Due  . Hepatitis C Screening  [birth year]  . HIV Screening  01/24/1979    There are no preventive care reminders to display for this patient.   Lab Results  Component Value Date   TSH 0.84 10/11/2017   Lab Results  Component Value Date   WBC 9.0 08/15/2018   HGB 13.7 08/15/2018   HCT 39.4 08/15/2018   MCV 91.8 08/15/2018   PLT 268 08/15/2018   Lab Results  Component Value Date   NA 138 08/15/2018   K 4.0 08/15/2018   CO2 22 08/15/2018   GLUCOSE 80 08/15/2018   BUN 16 08/15/2018   CREATININE 0.83 08/15/2018   BILITOT 0.4 08/15/2018   ALKPHOS 72 07/09/2017   AST 20 08/15/2018   ALT 18 08/15/2018   PROT 7.0 08/15/2018   ALBUMIN 4.1 07/09/2017   CALCIUM 9.2 08/15/2018  Lab Results  Component Value Date   CHOL 219 (H)  08/15/2018   Lab Results  Component Value Date   HDL 38 (L) 08/15/2018   Lab Results  Component Value Date   LDLCALC 149 (H) 08/15/2018   Lab Results  Component Value Date   TRIG 183 (H) 08/15/2018   Lab Results  Component Value Date   CHOLHDL 5.8 (H) 08/15/2018   Lab Results  Component Value Date   HGBA1C 5.7 (H) 08/15/2018       Assessment & Plan:   Problem List Items Addressed This Visit      Other   Gout   Relevant Orders   Hemoglobin A1c   Uric acid   COMPLETE METABOLIC PANEL WITH GFR   Dyslipidemia   Relevant Orders   Hemoglobin A1c   Uric acid   COMPLETE METABOLIC PANEL WITH GFR    Other Visit Diagnoses    Cough    -  Primary   Relevant Medications   benzonatate (TESSALON) 200 MG capsule   Other Relevant Orders   POCT Influenza A/B (Completed)   Hemoglobin A1c   Viral upper respiratory tract infection       Abnormal glucose       Relevant Orders   Hemoglobin A1c     Upper respiratory infection-negative for flu.  Will treat with symptomatic care including cough syrup at bedtime and albuterol as needed for wheezing.  We will also use Tessalon Perles during the daytime.  Provided for today and tomorrow.  Recommend rest and hydration.  Would like to go ahead and get some blood work drawn today because he has not been in a while.  We will going to get that updated and have him come back in 4 to 6 weeks to follow-up on his chronic medical problems.  Meds ordered this encounter  Medications  . HYDROcodone-homatropine (HYCODAN) 5-1.5 MG/5ML syrup    Sig: Take 5 mLs by mouth at bedtime as needed for cough.    Dispense:  120 mL    Refill:  0  . benzonatate (TESSALON) 200 MG capsule    Sig: Take 1 capsule (200 mg total) by mouth 2 (two) times daily as needed for cough.    Dispense:  20 capsule    Refill:  0     Nani Gasser, MD

## 2019-02-06 LAB — COMPLETE METABOLIC PANEL WITH GFR
AG Ratio: 1.7 (calc) (ref 1.0–2.5)
ALT: 22 U/L (ref 9–46)
AST: 21 U/L (ref 10–35)
Albumin: 4.5 g/dL (ref 3.6–5.1)
Alkaline phosphatase (APISO): 73 U/L (ref 35–144)
BUN: 12 mg/dL (ref 7–25)
CO2: 23 mmol/L (ref 20–32)
Calcium: 9.5 mg/dL (ref 8.6–10.3)
Chloride: 104 mmol/L (ref 98–110)
Creat: 0.7 mg/dL (ref 0.70–1.33)
GFR, Est African American: 123 mL/min/{1.73_m2} (ref >=60)
GFR, Est Non African American: 106 mL/min/{1.73_m2} (ref >=60)
Globulin: 2.7 g/dL (calc) (ref 1.9–3.7)
Glucose, Bld: 95 mg/dL (ref 65–99)
Potassium: 4.2 mmol/L (ref 3.5–5.3)
Sodium: 137 mmol/L (ref 135–146)
Total Bilirubin: 0.7 mg/dL (ref 0.2–1.2)
Total Protein: 7.2 g/dL (ref 6.1–8.1)

## 2019-02-06 LAB — HEMOGLOBIN A1C
Hgb A1c MFr Bld: 5.7 % of total Hgb — ABNORMAL HIGH (ref <5.7)
Mean Plasma Glucose: 117 (calc)
eAG (mmol/L): 6.5 (calc)

## 2019-02-06 LAB — URIC ACID: Uric Acid, Serum: 6 mg/dL (ref 4.0–8.0)

## 2019-02-16 ENCOUNTER — Other Ambulatory Visit: Payer: Self-pay | Admitting: Family Medicine

## 2019-02-16 DIAGNOSIS — M10071 Idiopathic gout, right ankle and foot: Secondary | ICD-10-CM

## 2019-02-25 ENCOUNTER — Telehealth: Payer: Self-pay

## 2019-02-25 MED ORDER — AZITHROMYCIN 250 MG PO TABS: ORAL_TABLET | ORAL | 0 refills | Status: AC

## 2019-02-25 MED ORDER — HYDROCODONE-HOMATROPINE 5-1.5 MG/5ML PO SYRP: 5.0000 mL | ORAL_SOLUTION | Freq: Every evening | ORAL | 0 refills | Status: DC | PRN

## 2019-02-25 NOTE — Telephone Encounter (Signed)
Both Rx sent. If not better by Monday to to come in for OV to be rechecked and maybe chest xray.

## 2019-02-25 NOTE — Telephone Encounter (Signed)
Albert Humphrey called and reports he is not feeling any better. He still has a cough, runny nose and chills. He states he is unable to sleep due to the cough. He is requesting an antibiotic and cough syrup.

## 2019-02-26 NOTE — Telephone Encounter (Signed)
Left message advising of recommendations.  

## 2019-04-01 ENCOUNTER — Other Ambulatory Visit: Payer: Self-pay | Admitting: Family Medicine

## 2019-07-15 ENCOUNTER — Ambulatory Visit: Payer: BC Managed Care – PPO | Admitting: Family Medicine

## 2019-07-15 ENCOUNTER — Other Ambulatory Visit: Payer: Self-pay

## 2019-07-15 ENCOUNTER — Encounter: Payer: Self-pay | Admitting: Family Medicine

## 2019-07-15 VITALS — BP 128/81 | HR 69 | Temp 97.4°F | Ht 71.0 in | Wt 316.0 lb

## 2019-07-15 DIAGNOSIS — Z1159 Encounter for screening for other viral diseases: Secondary | ICD-10-CM

## 2019-07-15 DIAGNOSIS — I4891 Unspecified atrial fibrillation: Secondary | ICD-10-CM

## 2019-07-15 DIAGNOSIS — Z125 Encounter for screening for malignant neoplasm of prostate: Secondary | ICD-10-CM | POA: Diagnosis not present

## 2019-07-15 DIAGNOSIS — R0602 Shortness of breath: Secondary | ICD-10-CM

## 2019-07-15 DIAGNOSIS — E66813 Obesity, class 3: Secondary | ICD-10-CM

## 2019-07-15 DIAGNOSIS — R7301 Impaired fasting glucose: Secondary | ICD-10-CM | POA: Diagnosis not present

## 2019-07-15 DIAGNOSIS — E782 Mixed hyperlipidemia: Secondary | ICD-10-CM | POA: Diagnosis not present

## 2019-07-15 DIAGNOSIS — M1A0710 Idiopathic chronic gout, right ankle and foot, without tophus (tophi): Secondary | ICD-10-CM

## 2019-07-15 DIAGNOSIS — E785 Hyperlipidemia, unspecified: Secondary | ICD-10-CM

## 2019-07-15 DIAGNOSIS — F419 Anxiety disorder, unspecified: Secondary | ICD-10-CM

## 2019-07-15 MED ORDER — DIAZEPAM 5 MG PO TABS: 2.5000 mg | ORAL_TABLET | Freq: Every day | ORAL | 0 refills | Status: DC | PRN

## 2019-07-15 MED ORDER — METAXALONE 800 MG PO TABS: 800.0000 mg | ORAL_TABLET | Freq: Two times a day (BID) | ORAL | 1 refills | Status: DC | PRN

## 2019-07-15 NOTE — Assessment & Plan Note (Signed)
Well-controlled on current regimen.  Due to check uric acid level.

## 2019-07-15 NOTE — Assessment & Plan Note (Signed)
Sounds like he had a cardioversion previously that was unsuccessful.  He would really like to have the A. fib treated again if at all possible.  For the short-term he did notice that Valium was helpful for just the sensation that he gets in his chest that just makes him feel tight and irritable.  We discussed that this can cause dependency and it has to be used sparingly but I did give him a small quantity to use.  Valium can also function as a muscle relaxer so I also sent over muscle relaxer just to that he could try that as well and see if he finds that helpful.

## 2019-07-15 NOTE — Assessment & Plan Note (Signed)
Due to recheck cholesterol.

## 2019-07-15 NOTE — Assessment & Plan Note (Signed)
We did discuss the possibility of bariatric surgery.  I do think it could be helpful for him I think it is a worthwhile consideration.  It also may be worth checking with his insurance to see if they would pay for any the long-term weight loss medications such as Saxenda etc.

## 2019-07-15 NOTE — Assessment & Plan Note (Signed)
Due to recheck A1c. 

## 2019-07-15 NOTE — Progress Notes (Signed)
Established Patient Office Visit  Subjective:  Patient ID: Albert Humphrey, male    DOB: 09/17/1966  Age: [age] y.o. MRN: [id number]  CC:  Chief Complaint  Patient presents with  . Follow-up    HPI Albert Humphrey presents for   Follow-up atrial fibrillation-no recent chest pain.  He does feel like he has shortness of breath when he walks across the room and is not sure how much of that is of A. fib and or his obesity.  He is actually been considering the possibility of bariatric surgery.  He is discussed that some with a friend who had it done.  F/U anxiety - Currently on sertraline 100 mg daily.  Doing well on the medication but says at times he just feels very tight and tense and stressed and irritable.  He feels like a lot of it comes from the A. fib in his chest.  He let me know he did take a friend's Valium and says that it actually seemed to really relax him and help and he was wondering if that could be something he could take regularly. GAd 7 score of 13 today.    F/U gout - currently on allopurinol daily and PRN colchicine.    Hyperlipidemia - tolerating stating well with no myalgias or significant side effects.   Lab Results  Component Value Date   CHOL 219 (H) 08/15/2018   CHOL 265 (H) 04/21/2018   CHOL 208 (H) 10/11/2017   Lab Results  Component Value Date   HDL 38 (L) 08/15/2018   HDL 41 04/21/2018   HDL 34 (L) 10/11/2017   Lab Results  Component Value Date   LDLCALC 149 (H) 08/15/2018   LDLCALC 165 (H) 04/21/2018   LDLCALC 129 (H) 10/11/2017   Lab Results  Component Value Date   TRIG 183 (H) 08/15/2018   TRIG 348 (H) 04/21/2018   TRIG 314 (H) 10/11/2017   Lab Results  Component Value Date   CHOLHDL 5.8 (H) 08/15/2018   CHOLHDL 6.5 (H) 04/21/2018   CHOLHDL 6.1 (H) 10/11/2017   Lab Results  Component Value Date   LDLDIRECT 155 (H) 03/16/2014   LDLDIRECT 158 (H) 03/16/2014   LDLDIRECT 108 (H) 10/08/2011     Impaired fasting glucose-no increased thirst  or urination. No symptoms consistent with hypoglycemia.   Past Medical History:  Diagnosis Date  . Anxiety   . Atrial fibrillation (HCC) 05/04/2015  . 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 Dyslipidemia   . Hypertension   . Obesity     Past Surgical History:  Procedure Laterality Date  . APPENDECTOMY    . ectinartial colon resection     partial  . HERNIA REPAIR    . HIP SURGERY    . TOTAL HIP ARTHROPLASTY Right 04/20/2013   5/5/20Dr. Doristine Counteravid Howe    Family History  Problem Relation Age of Onset  . Hypertension Mother   . Hypertension Father   . Alcohol abuse Father   . Heart disease Father        CVA  . Stroke Father   . Heart attack Other     Social History   Socioeconomic History  . Marital status: Single    Spouse name: Not on file  . Number of children: Not on file  . Years of education: Not on file  . Highest education level: Not on file  Occupational History  . Occupation: Magazine features editorTeacher    Employer: [employer]  Social Needs  .  Financial resource strain: Not on file  . Food insecurity    Worry: Not on file    Inability: Not on file  . Transportation needs    Medical: Not on file    Non-medical: Not on file  Tobacco Use  . Smoking status: Former Smoker    Packs/day: 1.00    Years: 30.00    Pack years: 30.00    Types: Cigarettes    Start date: 07/24/2012  . Smokeless tobacco: Never Used  Substance and Sexual Activity  . Alcohol use: Yes    Alcohol/week: 3.0 standard drinks    Types: 3 Standard drinks or equivalent per week    Comment: per week  . Drug use: No  . Sexual activity: Not on file  Lifestyle  . Physical activity    Days per week: Not on file    Minutes per session: Not on file  . Stress: Not on file  Relationships  . Social Herbalist on phone: Not on file    Gets together: Not on file    Attends religious service: Not on file    Active member of club or organization: Not on file    Attends meetings of clubs or  organizations: Not on file    Relationship status: Not on file  . Intimate partner violence    Fear of current or ex partner: Not on file    Emotionally abused: Not on file    Physically abused: Not on file    Forced sexual activity: Not on file  Other Topics Concern  . Not on file  Social History Narrative   No regular exercise.  Large amt of caffeine.     Outpatient Medications Prior to Visit  Medication Sig Dispense Refill  . allopurinol (ZYLOPRIM) 300 MG tablet TAKE 1 TABLET BY MOUTH EVERY DAY 90 tablet 2  . colchicine 0.6 MG tablet TAKE 1 TABLET (0.6 MG TOTAL) BY MOUTH DAILY. LAST REFILL. 90 tablet 1  . metFORMIN (GLUCOPHAGE) 500 MG tablet TAKE 1 TABLET (500 MG TOTAL) BY MOUTH 2 (TWO) TIMES DAILY WITH A MEAL 180 tablet 1  . metoprolol tartrate (LOPRESSOR) 50 MG tablet Take 1 tablet (50 mg total) by mouth 2 (two) times daily. 180 tablet 1  . Pitavastatin Calcium 2 MG TABS Take 1 tablet (2 mg total) by mouth every other day. At bedtime. 45 tablet 3  . sertraline (ZOLOFT) 100 MG tablet TAKE 2 TABLETS BY MOUTH EVERY DAY 180 tablet 1  . XARELTO 20 MG TABS tablet Take 1 tablet (20 mg total) by mouth daily. 90 tablet 3  . benzonatate (TESSALON) 200 MG capsule Take 1 capsule (200 mg total) by mouth 2 (two) times daily as needed for cough. 20 capsule 0  . HYDROcodone-homatropine (HYCODAN) 5-1.5 MG/5ML syrup Take 5 mLs by mouth at bedtime as needed for cough. 120 mL 0   No facility-administered medications prior to visit.     Allergies  Allergen Reactions  . Warfarin Sodium     Other reaction(s): Unknown  . Celexa [Citalopram Hydrobromide] Other (See Comments)    Weight gain  . Pravastatin Other (See Comments)    Joint aches  . Simvastatin Other (See Comments)    Joint aches    ROS Review of Systems    Objective:    Physical Exam  Constitutional: He is oriented to person, place, and time. He appears well-developed and well-nourished.  HENT:  Head: Normocephalic and  atraumatic.  Cardiovascular: Normal rate,  regular rhythm and normal heart sounds.  Pulmonary/Chest: Effort normal and breath sounds normal.  Neurological: He is alert and oriented to person, place, and time.  Skin: Skin is warm and dry.  Psychiatric: He has a normal mood and affect. His behavior is normal.    BP 128/81   Pulse 69   Temp (!) 97.4 F (36.3 C) (Oral)   Ht 5\' 11"  (1.803 m)   Wt (!) 316 lb (143.3 kg)   BMI 44.07 kg/m  Wt Readings from Last 3 Encounters:  07/15/19 (!) 316 lb (143.3 kg)  02/05/19 295 lb (133.8 kg)  08/15/18 (!) 313 lb (142 kg)     Health Maintenance Due  Topic Date Due  . Hepatitis C Screening  [birth year]21[birth year]  . HIV Screening  01/24/1979    There are no preventive care reminders to display for this patient.  Lab Results  Component Value Date   TSH 0.84 10/11/2017   Lab Results  Component Value Date   WBC 9.0 08/15/2018   HGB 13.7 08/15/2018   HCT 39.4 08/15/2018   MCV 91.8 08/15/2018   PLT 268 08/15/2018   Lab Results  Component Value Date   NA 137 02/05/2019   K 4.2 02/05/2019   CO2 23 02/05/2019   GLUCOSE 95 02/05/2019   BUN 12 02/05/2019   CREATININE 0.70 02/05/2019   BILITOT 0.7 02/05/2019   ALKPHOS 72 07/09/2017   AST 21 02/05/2019   ALT 22 02/05/2019   PROT 7.2 02/05/2019   ALBUMIN 4.1 07/09/2017   CALCIUM 9.5 02/05/2019   Lab Results  Component Value Date   CHOL 219 (H) 08/15/2018   Lab Results  Component Value Date   HDL 38 (L) 08/15/2018   Lab Results  Component Value Date   LDLCALC 149 (H) 08/15/2018   Lab Results  Component Value Date   TRIG 183 (H) 08/15/2018   Lab Results  Component Value Date   CHOLHDL 5.8 (H) 08/15/2018   Lab Results  Component Value Date   HGBA1C 5.7 (H) 02/05/2019      Assessment & Plan:   Problem List Items Addressed This Visit      Cardiovascular and Mediastinum   Atrial fibrillation (HCC)    Sounds like he had a cardioversion previously that was unsuccessful.  He  would really like to have the A. fib treated again if at all possible.  For the short-term he did notice that Valium was helpful for just the sensation that he gets in his chest that just makes him feel tight and irritable.  We discussed that this can cause dependency and it has to be used sparingly but I did give him a small quantity to use.  Valium can also function as a muscle relaxer so I also sent over muscle relaxer just to that he could try that as well and see if he finds that helpful.        Endocrine   IFG (impaired fasting glucose) - Primary    Due to recheck A1c.      Relevant Orders   Hemoglobin A1c     Other   Obesity, Class III, BMI 40-49.9 (morbid obesity) (HCC)    We did discuss the possibility of bariatric surgery.  I do think it could be helpful for him I think it is a worthwhile consideration.  It also may be worth checking with his insurance to see if they would pay for any the long-term weight loss medications such as Saxenda etc.  Mixed hyperlipidemia   Relevant Orders   Lipid panel   Gout    Well-controlled on current regimen.  Due to check uric acid level.      Relevant Orders   Uric acid   Dyslipidemia    Due to recheck cholesterol.      Anxiety    Continue with current regimen with sertraline.  Added PRN Valium.      Relevant Medications   diazepam (VALIUM) 5 MG tablet    Other Visit Diagnoses    Screening PSA (prostate specific antigen)       Relevant Orders   PSA   Encounter for hepatitis C screening test for low risk patient       Relevant Orders   Hepatitis C Antibody   SOB (shortness of breath)         Shortness of breath-we will schedule for spirometry next month.  Suspect secondary to his A. fib and deconditioning but he would like to have his lungs evaluated as well.  We can schedule at his convenience.  Meds ordered this encounter  Medications  . diazepam (VALIUM) 5 MG tablet    Sig: Take 0.5-1 tablets (2.5-5 mg total) by  mouth daily as needed for anxiety.    Dispense:  20 tablet    Refill:  0  . metaxalone (SKELAXIN) 800 MG tablet    Sig: Take 1 tablet (800 mg total) by mouth 2 (two) times daily as needed for muscle spasms.    Dispense:  40 tablet    Refill:  1    Follow-up: Return in about 2 weeks (around 07/29/2019) for spirometry.   Spent 40 minutes, greater than 50% time spent face-to-face in counseling about shortness of breath, obesity, impaired fasting glucose, atrial fibrillation, hyperlipidemia, gout etc.  Nani Gasseratherine Ameirah Khatoon, MD

## 2019-07-15 NOTE — Patient Instructions (Signed)
You can try the muscle relaxer as needed for the tightness that she get in the chest area just to try.  You can see if it helps.  If not then just let me know and we will take it off your medication list next time you are here.  Did send over a small quantity of Valium to use as needed.  Just remember to use it sparingly.

## 2019-07-15 NOTE — Assessment & Plan Note (Signed)
Continue with current regimen with sertraline.  Added PRN Valium.

## 2019-07-16 LAB — HEMOGLOBIN A1C
Hgb A1c MFr Bld: 5.9 % of total Hgb — ABNORMAL HIGH (ref <5.7)
Mean Plasma Glucose: 123 (calc)
eAG (mmol/L): 6.8 (calc)

## 2019-07-16 LAB — LIPID PANEL
Cholesterol: 312 mg/dL — ABNORMAL HIGH (ref <200)
HDL: 47 mg/dL (ref >=40)
Non-HDL Cholesterol (Calc): 265 mg/dL (calc) — ABNORMAL HIGH (ref <130)
Total CHOL/HDL Ratio: 6.6 (calc) — ABNORMAL HIGH (ref <5.0)
Triglycerides: 447 mg/dL — ABNORMAL HIGH (ref <150)

## 2019-07-16 LAB — HEPATITIS C ANTIBODY
Hepatitis C Ab: NONREACTIVE
SIGNAL TO CUT-OFF: 0.01 (ref <1.00)

## 2019-07-16 LAB — PSA: PSA: 0.4 ng/mL (ref <=4.0)

## 2019-07-16 LAB — URIC ACID: Uric Acid, Serum: 5.9 mg/dL (ref 4.0–8.0)

## 2019-07-28 ENCOUNTER — Other Ambulatory Visit: Payer: Self-pay

## 2019-07-28 ENCOUNTER — Ambulatory Visit: Payer: BC Managed Care – PPO | Admitting: Family Medicine

## 2019-07-28 VITALS — BP 124/82 | HR 76 | Temp 98.1°F | Resp 16 | Ht 71.0 in | Wt 313.0 lb

## 2019-07-28 DIAGNOSIS — R0602 Shortness of breath: Secondary | ICD-10-CM | POA: Diagnosis not present

## 2019-07-28 DIAGNOSIS — J984 Other disorders of lung: Secondary | ICD-10-CM

## 2019-07-28 NOTE — Progress Notes (Signed)
Patient presents to clinic for nurse visit for Spirometry. Patient has shortness of breath when he is walking and moving long distances more than 200-300 feet. Patient denies any chest pain. He reports that he has A-fib. Patient has started school and will need something to help him. He also reports that he could lose some weight and this would help.   Albuterol Sulfate sample given to patient.  EXP:09/15/2020 [id number] [phone]

## 2019-07-29 NOTE — Progress Notes (Signed)
Established Patient Office Visit  Subjective:  Patient ID: Albert Humphrey, male    DOB: 04-17-1966  Age: 53 y.o. MRN: [id number]  CC:  Chief Complaint  Patient presents with  . spirometry    HPI Albert Humphrey presents for spirometry.  Patient presents to clinic for nurse visit for Spirometry. Patient has shortness of breath when he is walking and moving long distances more than 200-300 feet. Patient denies any chest pain. He reports that he has A-fib. Patient has started school and will need something to help him. He also reports that he could lose some weight and this would help.  Past Medical History:  Diagnosis Date  . Anxiety   . Atrial fibrillation ([place]) 05/04/2015  . 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 Dyslipidemia   . Hypertension   . Obesity     Past Surgical History:  Procedure Laterality Date  . APPENDECTOMY    . ectinartial colon resection     partial  . HERNIA REPAIR    . HIP SURGERY    . TOTAL HIP ARTHROPLASTY Right 04/20/2013   5/5/20Dr. Tacey Ruiz    Family History  Problem Relation Age of Onset  . Hypertension Father   . Alcohol abuse Father   . Heart disease Father        CVA  . Stroke Father   . Heart attack Other   . Hypertension Mother     Social History   Socioeconomic History  . Marital status: Single    Spouse name: Not on file  . Number of children: Not on file  . Years of education: Not on file  . Highest education level: Not on file  Occupational History  . Occupation: Product manager: [employer]  . Financial resource strain: Not on file  . Food insecurity    Worry: Not on file    Inability: Not on file  . Transportation needs    Medical: Not on file    Non-medical: Not on file  Tobacco Use  . Smoking status: Former Smoker    Packs/day: 1.00    Years: 30.00    Pack years: 30.00    Types: Cigarettes    Start date: 07/24/2012  . Smokeless tobacco: Never Used  Substance and Sexual Activity  .  Alcohol use: Yes    Alcohol/week: 3.0 standard drinks    Types: 3 Standard drinks or equivalent per week    Comment: per week  . Drug use: No  . Sexual activity: Not on file  Lifestyle  . Physical activity    Days per week: Not on file    Minutes per session: Not on file  . Stress: Not on file  Relationships  . Social Herbalist on phone: Not on file    Gets together: Not on file    Attends religious service: Not on file    Active member of club or organization: Not on file    Attends meetings of clubs or organizations: Not on file    Relationship status: Not on file  . Intimate partner violence    Fear of current or ex partner: Not on file    Emotionally abused: Not on file    Physically abused: Not on file    Forced sexual activity: Not on file  Other Topics Concern  . Not on file  Social History Narrative   No regular exercise.  Large amt of  caffeine.     Outpatient Medications Prior to Visit  Medication Sig Dispense Refill  . allopurinol (ZYLOPRIM) 300 MG tablet TAKE 1 TABLET BY MOUTH EVERY DAY 90 tablet 2  . colchicine 0.6 MG tablet TAKE 1 TABLET (0.6 MG TOTAL) BY MOUTH DAILY. LAST REFILL. 90 tablet 1  . diazepam (VALIUM) 5 MG tablet Take 0.5-1 tablets (2.5-5 mg total) by mouth daily as needed for anxiety. 20 tablet 0  . metaxalone (SKELAXIN) 800 MG tablet Take 1 tablet (800 mg total) by mouth 2 (two) times daily as needed for muscle spasms. 40 tablet 1  . metoprolol tartrate (LOPRESSOR) 50 MG tablet Take 1 tablet (50 mg total) by mouth 2 (two) times daily. 180 tablet 1  . Pitavastatin Calcium 2 MG TABS Take 1 tablet (2 mg total) by mouth every other day. At bedtime. 45 tablet 3  . sertraline (ZOLOFT) 100 MG tablet TAKE 2 TABLETS BY MOUTH EVERY DAY 180 tablet 1  . XARELTO 20 MG TABS tablet Take 1 tablet (20 mg total) by mouth daily. 90 tablet 3  . metFORMIN (GLUCOPHAGE) 500 MG tablet TAKE 1 TABLET (500 MG TOTAL) BY MOUTH 2 (TWO) TIMES DAILY WITH A MEAL 180  tablet 1   No facility-administered medications prior to visit.     Allergies  Allergen Reactions  . Warfarin Sodium     Other reaction(s): Unknown  . Celexa [Citalopram Hydrobromide] Other (See Comments)    Weight gain  . Pravastatin Other (See Comments)    Joint aches  . Simvastatin Other (See Comments)    Joint aches    ROS Review of Systems    Objective:    Physical Exam  Constitutional: He is oriented to person, place, and time. He appears well-developed and well-nourished.  HENT:  Head: Normocephalic and atraumatic.  Cardiovascular: Normal rate, regular rhythm and normal heart sounds.  Pulmonary/Chest: Effort normal and breath sounds normal.  Neurological: He is alert and oriented to person, place, and time.  Skin: Skin is warm and dry.  Psychiatric: He has a normal mood and affect. His behavior is normal.    BP 124/82   Pulse 76   Temp 98.1 F (36.7 C) (Oral)   Resp 16   Ht 5\' 11"  (1.803 m)   Wt (!) 313 lb (142 kg)   BMI 43.65 kg/m  Wt Readings from Last 3 Encounters:  07/28/19 (!) 313 lb (142 kg)  07/15/19 (!) 316 lb (143.3 kg)  02/05/19 295 lb (133.8 kg)     Health Maintenance Due  Topic Date Due  . HIV Screening  01/24/1979    There are no preventive care reminders to display for this patient.  Lab Results  Component Value Date   TSH 0.84 10/11/2017   Lab Results  Component Value Date   WBC 9.0 08/15/2018   HGB 13.7 08/15/2018   HCT 39.4 08/15/2018   MCV 91.8 08/15/2018   PLT 268 08/15/2018   Lab Results  Component Value Date   NA 137 02/05/2019   K 4.2 02/05/2019   CO2 23 02/05/2019   GLUCOSE 95 02/05/2019   BUN 12 02/05/2019   CREATININE 0.70 02/05/2019   BILITOT 0.7 02/05/2019   ALKPHOS 72 07/09/2017   AST 21 02/05/2019   ALT 22 02/05/2019   PROT 7.2 02/05/2019   ALBUMIN 4.1 07/09/2017   CALCIUM 9.5 02/05/2019   Lab Results  Component Value Date   CHOL 312 (H) 07/15/2019   Lab Results  Component Value Date  HDL  47 07/15/2019   Lab Results  Component Value Date   [facility]  07/15/2019     Comment:     . LDL cholesterol not calculated. Triglyceride levels greater than 400 mg/dL invalidate calculated LDL results. . Reference range: <100 . Desirable range <100 mg/dL for primary prevention;   <70 mg/dL for patients with CHD or diabetic patients  with > or = 2 CHD risk factors. Marland Kitchen. LDL-C is now calculated using the Martin-Hopkins  calculation, which is a validated novel method providing  better accuracy than the Friedewald equation in the  estimation of LDL-C.  Horald PollenMartin SS et al. Lenox AhrJAMA. 6962;952(842013;310(19): 2061-2068  (http://education.[facility].com/faq/FAQ164)    Lab Results  Component Value Date   TRIG 447 (H) 07/15/2019   Lab Results  Component Value Date   CHOLHDL 6.6 (H) 07/15/2019   Lab Results  Component Value Date   HGBA1C 5.9 (H) 07/15/2019      Assessment & Plan:   Problem List Items Addressed This Visit      Respiratory   Restrictive lung disease     Other   Shortness of breath - Primary    Effort was good bur flow curve ws flat so suspect test was not optimal. It showed restrictive and obstructive disease with great response to albuterol. Did have him repeat about 30 min later some improvement and much better flow curve.  See note.  Will refer for CT of chest for further workup.  Never smoker.  Ok to use albuterol PRN. Given albuterol MDI used in the testing.        Relevant Orders   PR EVAL OF BRONCHOSPASM   CT Chest Wo Contrast (Completed)     Spirometry shows restriction and obstruction . I did have him repeat the 2nd part of the test about 30 minutes later to see if his effort was better.  Repeat showed 50% FVC and 55% FVC with ratio of 83.  I think has a combo of obstruction and restriction.   Indicating significant COPD. I would like to eval further with CT of chest.  In addition discussed the importance of weight loss to reduce restriction.    No orders of  the defined types were placed in this encounter.   Follow-up: Return if symptoms worsen or fail to improve.    Nani Gasseratherine Metheney, MD

## 2019-07-30 ENCOUNTER — Ambulatory Visit (INDEPENDENT_AMBULATORY_CARE_PROVIDER_SITE_OTHER): Payer: BC Managed Care – PPO

## 2019-07-30 ENCOUNTER — Other Ambulatory Visit: Payer: Self-pay

## 2019-07-30 ENCOUNTER — Other Ambulatory Visit: Payer: Self-pay | Admitting: Family Medicine

## 2019-07-30 ENCOUNTER — Telehealth: Payer: Self-pay | Admitting: Family Medicine

## 2019-07-30 ENCOUNTER — Encounter: Payer: Self-pay | Admitting: Family Medicine

## 2019-07-30 DIAGNOSIS — J984 Other disorders of lung: Secondary | ICD-10-CM | POA: Insufficient documentation

## 2019-07-30 DIAGNOSIS — R0602 Shortness of breath: Secondary | ICD-10-CM

## 2019-07-30 DIAGNOSIS — J449 Chronic obstructive pulmonary disease, unspecified: Secondary | ICD-10-CM

## 2019-07-30 NOTE — Telephone Encounter (Signed)
Prior auth obtained for CT chest without contrast. Auth # [id number] good from 07/30/19 to 01/25/20.

## 2019-07-30 NOTE — Assessment & Plan Note (Signed)
Effort was good bur flow curve ws flat so suspect test was not optimal. It showed restrictive and obstructive disease with great response to albuterol. Did have him repeat about 30 min later some improvement and much better flow curve.  See note.  Will refer for CT of chest for further workup.  Never smoker.  Ok to use albuterol PRN. Given albuterol MDI used in the testing.

## 2019-08-20 ENCOUNTER — Telehealth: Payer: Self-pay

## 2019-08-20 MED ORDER — PREDNISONE 20 MG PO TABS: ORAL_TABLET | ORAL | 0 refills | Status: DC

## 2019-08-20 NOTE — Telephone Encounter (Signed)
Pt left vm stating that he is having a gout flare.  He said that he is taking allopurinol and colchicine neither are helping.  He is requesting something different. Please advise. -EH/RMA

## 2019-08-20 NOTE — Telephone Encounter (Signed)
Prednisone sent to pharmacy.  Hold the colchine while on the prednisone.

## 2019-08-21 NOTE — Telephone Encounter (Signed)
Pt advised.

## 2019-09-16 ENCOUNTER — Other Ambulatory Visit: Payer: Self-pay | Admitting: Family Medicine

## 2019-09-16 DIAGNOSIS — M10071 Idiopathic gout, right ankle and foot: Secondary | ICD-10-CM

## 2019-09-19 ENCOUNTER — Other Ambulatory Visit: Payer: Self-pay | Admitting: Family Medicine

## 2019-09-21 ENCOUNTER — Other Ambulatory Visit: Payer: Self-pay | Admitting: Family Medicine

## 2019-09-21 DIAGNOSIS — I4891 Unspecified atrial fibrillation: Secondary | ICD-10-CM

## 2019-09-25 ENCOUNTER — Telehealth: Payer: Self-pay | Admitting: Family Medicine

## 2019-09-25 NOTE — Telephone Encounter (Signed)
Patient would like a refill of the sertraline (ZOLOFT) 100 MG tablet [[id number]] until his next appointment with PCP. Please advise.

## 2019-09-25 NOTE — Telephone Encounter (Signed)
Rx was sent on 09/22/19 to [facility].   Left VM for Pt to call pharmacy.

## 2019-10-05 ENCOUNTER — Encounter: Payer: Self-pay | Admitting: Family Medicine

## 2019-10-05 ENCOUNTER — Ambulatory Visit (INDEPENDENT_AMBULATORY_CARE_PROVIDER_SITE_OTHER): Payer: BC Managed Care – PPO | Admitting: Family Medicine

## 2019-10-05 DIAGNOSIS — F419 Anxiety disorder, unspecified: Secondary | ICD-10-CM

## 2019-10-05 DIAGNOSIS — I4891 Unspecified atrial fibrillation: Secondary | ICD-10-CM

## 2019-10-05 DIAGNOSIS — E66813 Obesity, class 3: Secondary | ICD-10-CM

## 2019-10-05 DIAGNOSIS — M10071 Idiopathic gout, right ankle and foot: Secondary | ICD-10-CM

## 2019-10-05 DIAGNOSIS — M1A0710 Idiopathic chronic gout, right ankle and foot, without tophus (tophi): Secondary | ICD-10-CM

## 2019-10-05 DIAGNOSIS — E782 Mixed hyperlipidemia: Secondary | ICD-10-CM | POA: Diagnosis not present

## 2019-10-05 MED ORDER — SERTRALINE HCL 100 MG PO TABS: 200.0000 mg | ORAL_TABLET | Freq: Every day | ORAL | 1 refills | Status: DC

## 2019-10-05 MED ORDER — VICTOZA 18 MG/3ML ~~LOC~~ SOPN: PEN_INJECTOR | SUBCUTANEOUS | 0 refills | Status: DC

## 2019-10-05 MED ORDER — ALLOPURINOL 300 MG PO TABS: 300.0000 mg | ORAL_TABLET | Freq: Every day | ORAL | 1 refills | Status: DC

## 2019-10-05 MED ORDER — XARELTO 20 MG PO TABS: 20.0000 mg | ORAL_TABLET | Freq: Every day | ORAL | 3 refills | Status: DC

## 2019-10-05 MED ORDER — DIAZEPAM 5 MG PO TABS: 2.5000 mg | ORAL_TABLET | Freq: Every day | ORAL | 0 refills | Status: DC | PRN

## 2019-10-05 NOTE — Assessment & Plan Note (Addendum)
Continue colchicine and allopurinol together for another 1 to 2 months and at that point try discontinuing the colchicine and just staying on the allopurinol.  We will plan to recheck uric acid and then we can adjust his dose if needed.

## 2019-10-05 NOTE — Progress Notes (Signed)
Virtual Visit via Video Note  I connected with Albert Humphrey on 10/05/19 at  3:00 PM EDT by a video enabled telemedicine application and verified that I am speaking with the correct person using two identifiers.   I discussed the limitations of evaluation and management by telemedicine and the availability of in person appointments. The patient expressed understanding and agreed to proceed.   Established Patient Office Visit  Subjective:  Patient ID: Albert Humphrey, male    DOB: 1966/12/17  Age: 53 y.o. MRN: [id number]  CC:  Chief Complaint  Patient presents with  . Follow-up    HPI Albert Humphrey presents for atrial fibrillation-has not had any recent chest pain he still gets occasional shortness of breath with activity.  Some of that he notices his weight but some of it he thinks is the atrial fibrillation.  Gout -Overall he is doing well.  He did have a flare since I last saw him and he actually called in and we had sent in the colchicine he has been taking the colchicine and the allopurinol for the last month or so.  Says it was extremely painful when it flared this last time.  Morbid obesity-wanted to discuss options for weight loss.  We will try to see if his insurance would cover some of the newer medications but unfortunately they would not.  He is really been giving some serious thought to bariatric surgery as well.  He also wanted to let me know that he did get his shingles vaccine and flu vaccine done at the local pharmacy about a week ago on Tuesday.  Anxiety-he is currently on Zoloft and doing well.  I did give him a small quantity of diazepam, 20 tabs about 3 months ago.  He says he is tried really hard to just use it sparingly but he is asking for refill today.  Past Medical History:  Diagnosis Date  . Anxiety   . Atrial fibrillation ([place]) 05/04/2015  . 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 Dyslipidemia   . Hypertension   . Obesity     Past Surgical History:  Procedure  Laterality Date  . APPENDECTOMY    . ectinartial colon resection     partial  . HERNIA REPAIR    . HIP SURGERY    . TOTAL HIP ARTHROPLASTY Right 04/20/2013   5/5/20Dr. Tacey Ruiz    Family History  Problem Relation Age of Onset  . Hypertension Father   . Alcohol abuse Father   . Heart disease Father        CVA  . Stroke Father   . Heart attack Other   . Hypertension Mother     Social History   Socioeconomic History  . Marital status: Single    Spouse name: Not on file  . Number of children: Not on file  . Years of education: Not on file  . Highest education level: Not on file  Occupational History  . Occupation: Product manager: [employer]  . Financial resource strain: Not on file  . Food insecurity    Worry: Not on file    Inability: Not on file  . Transportation needs    Medical: Not on file    Non-medical: Not on file  Tobacco Use  . Smoking status: Former Smoker    Packs/day: 1.00    Years: 30.00    Pack years: 30.00    Types: Cigarettes    Start  date: 07/24/2012  . Smokeless tobacco: Never Used  Substance and Sexual Activity  . Alcohol use: Yes    Alcohol/week: 3.0 standard drinks    Types: 3 Standard drinks or equivalent per week    Comment: per week  . Drug use: No  . Sexual activity: Not on file  Lifestyle  . Physical activity    Days per week: Not on file    Minutes per session: Not on file  . Stress: Not on file  Relationships  . Social Musicianconnections    Talks on phone: Not on file    Gets together: Not on file    Attends religious service: Not on file    Active member of club or organization: Not on file    Attends meetings of clubs or organizations: Not on file    Relationship status: Not on file  . Intimate partner violence    Fear of current or ex partner: Not on file    Emotionally abused: Not on file    Physically abused: Not on file    Forced sexual activity: Not on file  Other Topics Concern  . Not on file   Social History Narrative   No regular exercise.  Large amt of caffeine.     Outpatient Medications Prior to Visit  Medication Sig Dispense Refill  . colchicine 0.6 MG tablet TAKE 1 TABLET (0.6 MG TOTAL) BY MOUTH DAILY 90 tablet 1  . metaxalone (SKELAXIN) 800 MG tablet TAKE 1 TABLET (800 MG TOTAL) BY MOUTH 2 (TWO) TIMES DAILY AS NEEDED FOR MUSCLE SPASMS. 40 tablet 1  . metFORMIN (GLUCOPHAGE) 500 MG tablet TAKE 1 TABLET (500 MG TOTAL) BY MOUTH 2 (TWO) TIMES DAILY WITH A MEAL 180 tablet 1  . metoprolol tartrate (LOPRESSOR) 50 MG tablet TAKE 1 TABLET BY MOUTH TWICE A DAY 180 tablet 1  . allopurinol (ZYLOPRIM) 300 MG tablet TAKE 1 TABLET BY MOUTH EVERY DAY 90 tablet 2  . diazepam (VALIUM) 5 MG tablet Take 0.5-1 tablets (2.5-5 mg total) by mouth daily as needed for anxiety. 20 tablet 0  . Pitavastatin Calcium 2 MG TABS Take 1 tablet (2 mg total) by mouth every other day. At bedtime. 45 tablet 3  . predniSONE (DELTASONE) 20 MG tablet 2 tab po QD x 4 days, then 1 tab QD x 4 days, then 1/2 Tab QD x 2 days 13 tablet 0  . sertraline (ZOLOFT) 100 MG tablet TAKE 2 TABLETS BY MOUTH EVERY DAY 60 tablet 0  . XARELTO 20 MG TABS tablet Take 1 tablet (20 mg total) by mouth daily. 90 tablet 3   No facility-administered medications prior to visit.     Allergies  Allergen Reactions  . Warfarin Sodium     Other reaction(s): Unknown  . Celexa [Citalopram Hydrobromide] Other (See Comments)    Weight gain  . Pravastatin Other (See Comments)    Joint aches  . Simvastatin Other (See Comments)    Joint aches    ROS Review of Systems    Objective:    Physical Exam  There were no vitals taken for this visit. Wt Readings from Last 3 Encounters:  07/28/19 (!) 313 lb (142 kg)  07/15/19 (!) 316 lb (143.3 kg)  02/05/19 295 lb (133.8 kg)     There are no preventive care reminders to display for this patient.  There are no preventive care reminders to display for this patient.  Lab Results   Component Value Date   TSH 0.84 10/11/2017  Lab Results  Component Value Date   WBC 9.0 08/15/2018   HGB 13.7 08/15/2018   HCT 39.4 08/15/2018   MCV 91.8 08/15/2018   PLT 268 08/15/2018   Lab Results  Component Value Date   NA 137 02/05/2019   K 4.2 02/05/2019   CO2 23 02/05/2019   GLUCOSE 95 02/05/2019   BUN 12 02/05/2019   CREATININE 0.70 02/05/2019   BILITOT 0.7 02/05/2019   ALKPHOS 72 07/09/2017   AST 21 02/05/2019   ALT 22 02/05/2019   PROT 7.2 02/05/2019   ALBUMIN 4.1 07/09/2017   CALCIUM 9.5 02/05/2019   Lab Results  Component Value Date   CHOL 312 (H) 07/15/2019   Lab Results  Component Value Date   HDL 47 07/15/2019   Lab Results  Component Value Date   LDLCALC  07/15/2019     Comment:     . LDL cholesterol not calculated. Triglyceride levels greater than 400 mg/dL invalidate calculated LDL results. . Reference range: <100 . Desirable range <100 mg/dL for primary prevention;   <70 mg/dL for patients with CHD or diabetic patients  with > or = 2 CHD risk factors. Marland Kitchen LDL-C is now calculated using the Martin-Hopkins  calculation, which is a validated novel method providing  better accuracy than the Friedewald equation in the  estimation of LDL-C.  Horald Pollen et al. Lenox Ahr. 5621;308(65): 2061-2068  (http://education.[facility].com/faq/FAQ164)    Lab Results  Component Value Date   TRIG 447 (H) 07/15/2019   Lab Results  Component Value Date   CHOLHDL 6.6 (H) 07/15/2019   Lab Results  Component Value Date   HGBA1C 5.9 (H) 07/15/2019      Assessment & Plan:   Problem List Items Addressed This Visit      Cardiovascular and Mediastinum   Atrial fibrillation (HCC)    Continue current regimen.  Xarelto refilled for 1 year.  He has no longer on a statin.      Relevant Medications   XARELTO 20 MG TABS tablet     Other   Obesity, Class III, BMI 40-49.9 (morbid obesity) (HCC)    Discussed options.  He is prediabetic so would like to  see if insurance would cover Victoza.  Ultimately I do think he would benefit from being in a bariatric program and possibly having more definitive treatment with weight loss surgery.  He is can continue to do some research and talk to people about it.      Relevant Medications   liraglutide (VICTOZA) 18 MG/3ML SOPN   Mixed hyperlipidemia   Relevant Medications   XARELTO 20 MG TABS tablet   Gout    Continue colchicine and allopurinol together for another 1 to 2 months and at that point try discontinuing the colchicine and just staying on the allopurinol.  We will plan to recheck uric acid and then we can adjust his dose if needed.      Relevant Medications   allopurinol (ZYLOPRIM) 300 MG tablet   Anxiety - Primary    Doing well overall.  Currently on Zoloft 200 mg daily.  Did refill diazepam just reminded him to continue to use sparingly.      Relevant Medications   sertraline (ZOLOFT) 100 MG tablet   diazepam (VALIUM) 5 MG tablet      Meds ordered this encounter  Medications  . XARELTO 20 MG TABS tablet    Sig: Take 1 tablet (20 mg total) by mouth daily.    Dispense:  90 tablet  Refill:  3  . allopurinol (ZYLOPRIM) 300 MG tablet    Sig: Take 1 tablet (300 mg total) by mouth daily.    Dispense:  90 tablet    Refill:  1  . sertraline (ZOLOFT) 100 MG tablet    Sig: Take 2 tablets (200 mg total) by mouth daily.    Dispense:  180 tablet    Refill:  1  . diazepam (VALIUM) 5 MG tablet    Sig: Take 0.5-1 tablets (2.5-5 mg total) by mouth daily as needed for anxiety.    Dispense:  20 tablet    Refill:  0  . liraglutide (VICTOZA) 18 MG/3ML SOPN    Sig: Inject 0.1 mLs (0.6 mg total) into the skin daily for 7 days, THEN 0.2 mLs (1.2 mg total) daily for 7 days, THEN 0.3 mLs (1.8 mg total) daily for 7 days, THEN 0.4 mLs (2.4 mg total) daily for 9 days.    Dispense:  9 mL    Refill:  0    Follow-up: Return in about 4 weeks (around 11/02/2019) for weight loss if med covered by  insurance. .     I discussed the assessment and treatment plan with the patient. The patient was provided an opportunity to ask questions and all were answered. The patient agreed with the plan and demonstrated an understanding of the instructions.   The patient was advised to call back or seek an in-person evaluation if the symptoms worsen or if the condition fails to improve as anticipated.   Nani Gasser, MDSOB he has to stop to catch his breath especially when walking long distances. Nani Gasser, MD

## 2019-10-05 NOTE — Assessment & Plan Note (Signed)
Doing well overall.  Currently on Zoloft 200 mg daily.  Did refill diazepam just reminded him to continue to use sparingly.

## 2019-10-05 NOTE — Assessment & Plan Note (Signed)
Continue current regimen.  Xarelto refilled for 1 year.  He has no longer on a statin.

## 2019-10-05 NOTE — Assessment & Plan Note (Signed)
Discussed options.  He is prediabetic so would like to see if insurance would cover Victoza.  Ultimately I do think he would benefit from being in a bariatric program and possibly having more definitive treatment with weight loss surgery.  He is can continue to do some research and talk to people about it.

## 2019-10-07 ENCOUNTER — Encounter: Payer: Self-pay | Admitting: Family Medicine

## 2019-10-08 MED ORDER — PEN NEEDLES 30G X 8 MM MISC: 1.0000 | Freq: Every day | 1 refills | Status: DC

## 2019-10-26 ENCOUNTER — Telehealth: Payer: Self-pay

## 2019-10-26 NOTE — Telephone Encounter (Signed)
Unable to lvm phone rang and then just hung up.Maryruth Eve, Lahoma Crocker, CMA

## 2019-10-26 NOTE — Telephone Encounter (Signed)
Albert Humphrey called and left a message stating the Victoza pen does not go up to 2.4 mg. He wanted to know what is the best way to take the 2.4 mg dose. Please advise.

## 2019-10-26 NOTE — Telephone Encounter (Signed)
Yes, he is correct. I apologize.  List to stick with the 1.8 mg for now and see how he does on it.  I had quite a few patients do well with even just the 1.8 mg.  The Saxenda which is the weight loss version of Victoza actually goes up to 3.0.

## 2019-10-27 MED ORDER — VICTOZA 18 MG/3ML ~~LOC~~ SOPN: 1.8000 mg | PEN_INJECTOR | Freq: Every day | SUBCUTANEOUS | 0 refills | Status: DC

## 2019-10-27 NOTE — Telephone Encounter (Signed)
Patient advised of recommendations. A refill has been sent for the 1.8 mg daily dose.

## 2019-10-27 NOTE — Telephone Encounter (Signed)
Unable to leave a message, phone rang and hung up.

## 2019-10-28 ENCOUNTER — Other Ambulatory Visit: Payer: Self-pay | Admitting: Family Medicine

## 2019-10-28 DIAGNOSIS — I4891 Unspecified atrial fibrillation: Secondary | ICD-10-CM

## 2019-12-14 ENCOUNTER — Other Ambulatory Visit: Payer: Self-pay | Admitting: *Deleted

## 2019-12-14 DIAGNOSIS — I4891 Unspecified atrial fibrillation: Secondary | ICD-10-CM

## 2019-12-14 MED ORDER — XARELTO 20 MG PO TABS: 20.0000 mg | ORAL_TABLET | Freq: Every day | ORAL | 3 refills | Status: DC

## 2019-12-22 ENCOUNTER — Telehealth: Payer: Self-pay | Admitting: Family Medicine

## 2019-12-22 NOTE — Telephone Encounter (Signed)
Patient had left a message that he had a questions about the COVID-19 vaccine. I have left a message for the patient to call back.

## 2019-12-22 NOTE — Telephone Encounter (Signed)
Patient called back and he is going to reach out to the health department and see about getting his vaccine series. He did not have any questions.

## 2020-01-24 ENCOUNTER — Other Ambulatory Visit: Payer: Self-pay | Admitting: Family Medicine

## 2020-01-27 ENCOUNTER — Other Ambulatory Visit: Payer: Self-pay | Admitting: Family Medicine

## 2020-03-01 ENCOUNTER — Encounter: Payer: Self-pay | Admitting: Family Medicine

## 2020-03-01 ENCOUNTER — Telehealth: Payer: BC Managed Care – PPO | Admitting: Family Medicine

## 2020-03-01 ENCOUNTER — Telehealth (INDEPENDENT_AMBULATORY_CARE_PROVIDER_SITE_OTHER): Payer: BC Managed Care – PPO | Admitting: Family Medicine

## 2020-03-01 DIAGNOSIS — R197 Diarrhea, unspecified: Secondary | ICD-10-CM | POA: Diagnosis not present

## 2020-03-01 DIAGNOSIS — R1031 Right lower quadrant pain: Secondary | ICD-10-CM | POA: Diagnosis not present

## 2020-03-01 MED ORDER — AMOXICILLIN-POT CLAVULANATE 875-125 MG PO TABS: 1.0000 | ORAL_TABLET | Freq: Two times a day (BID) | ORAL | 0 refills | Status: AC

## 2020-03-01 MED ORDER — TRAMADOL HCL 50 MG PO TABS: 50.0000 mg | ORAL_TABLET | Freq: Four times a day (QID) | ORAL | 0 refills | Status: AC | PRN

## 2020-03-01 NOTE — Progress Notes (Signed)
He thinks that he

## 2020-03-01 NOTE — Progress Notes (Signed)
Virtual Visit via Video Note  I connected with Albert Humphrey on 03/01/20 at 11:30 AM EDT by a video enabled telemedicine application and verified that I am speaking with the correct person using two identifiers.   I discussed the limitations of evaluation and management by telemedicine and the availability of in person appointments. The patient expressed understanding and agreed to proceed.  Patient was at home lying in bed and I was in my office for this visit.  Subjective:    CC: Abdominal pain.  HPI: 54 year old male with a prior history of diverticulitis started getting some chills and nausea yesterday.  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  He has had his appendix removed.  He also had some loose stools today but did not notice any blood.  He says his pain is a 10 out of 10 today.  He says it feels very similar to when he had diverticulitis a couple of years ago.  Says it hurts to walk and take a step.  Just motion makes his abdomen hurt.  He also has some discomfort right before he urinates but not during urination.  No fevers or chills today.  No nausea or vomiting today.   Past medical history, Surgical history, Family history not pertinant except as noted below, Social history, Allergies, and medications have been entered into the medical record, reviewed, and corrections made.   Review of Systems: No fevers, chills, night sweats, weight loss, chest pain, or shortness of breath.   Objective:    General: Speaking clearly in complete sentences without any shortness of breath.  Alert and oriented x3.  Normal judgment. No apparent acute distress.    Impression and Recommendations:    No problem-specific Assessment & Plan notes found for this encounter.  Right lower quadrant pain/lower abdominal  pain-he feels it is most consistent with diverticulitis which she has had several times previously.  We did discuss that if his pain increases or if any point he develops a fever or not nausea or vomiting to please go to the emergency department for further evaluation as he could have an abscess and he does understand this.  I did go ahead and prescribe Augmentin as well as tramadol for pain control but did warn that I do not want to subdue his pain too much so that if it is worsening that he is aware.  Really encourage him to eat a bland diet such as soup broths Jell-O etc. but make sure you are staying really well-hydrated.  Also discussed consideration of getting CT of the abdomen.  He is opting for antibiotics and just going to the emergency department if he gets worse.    Time spent in encounter 25 minutes  I discussed the assessment and treatment plan with the patient. The patient was provided an opportunity to ask questions and all were answered. The patient agreed with the plan and demonstrated an understanding of the instructions.   The patient was advised to call back or seek an in-person evaluation if the symptoms worsen or if the condition fails to improve as anticipated.   Nani Gasser, MD

## 2020-03-02 ENCOUNTER — Encounter: Payer: Self-pay | Admitting: Family Medicine

## 2020-03-21 ENCOUNTER — Telehealth: Payer: Self-pay | Admitting: Family Medicine

## 2020-03-21 NOTE — Telephone Encounter (Signed)
Is there anyway he can come in today? If not then see if he can come in at 7:45 tomorrow.

## 2020-03-21 NOTE — Telephone Encounter (Signed)
Left patient a voicemail with information below. Let patient know to call us back to schedule an appointment. 

## 2020-03-21 NOTE — Telephone Encounter (Signed)
Patient wanting to come in for a physical. Can only come tomorrow since this is his spring break from school. Would like to see if you can work him in tomorrow. No open slots. Please advise.

## 2020-03-21 NOTE — Telephone Encounter (Signed)
Appointment has been made for tomorrow. No further questions at this time.  °

## 2020-03-22 ENCOUNTER — Encounter: Payer: Self-pay | Admitting: Family Medicine

## 2020-03-22 ENCOUNTER — Other Ambulatory Visit: Payer: Self-pay

## 2020-03-22 ENCOUNTER — Ambulatory Visit (INDEPENDENT_AMBULATORY_CARE_PROVIDER_SITE_OTHER): Payer: BC Managed Care – PPO | Admitting: Family Medicine

## 2020-03-22 VITALS — BP 106/75 | HR 56 | Ht 71.0 in | Wt 314.0 lb

## 2020-03-22 DIAGNOSIS — Z0000 Encounter for general adult medical examination without abnormal findings: Secondary | ICD-10-CM

## 2020-03-22 DIAGNOSIS — I4891 Unspecified atrial fibrillation: Secondary | ICD-10-CM

## 2020-03-22 MED ORDER — METOPROLOL TARTRATE 50 MG PO TABS: 50.0000 mg | ORAL_TABLET | Freq: Two times a day (BID) | ORAL | 1 refills | Status: DC

## 2020-03-22 MED ORDER — VICTOZA 18 MG/3ML ~~LOC~~ SOPN: PEN_INJECTOR | SUBCUTANEOUS | 3 refills | Status: DC

## 2020-03-22 MED ORDER — SERTRALINE HCL 100 MG PO TABS: 200.0000 mg | ORAL_TABLET | Freq: Every day | ORAL | 1 refills | Status: DC

## 2020-03-22 NOTE — Progress Notes (Signed)
Established Patient Office Visit  Subjective:  Patient ID: Albert Humphrey, male    DOB: [birth year]  Age: 54 y.o. MRN: [id number]  CC:  Chief Complaint  Patient presents with  . Annual Exam    HPI Albert Humphrey presents for CPE.  He is doing well today without any significant problems.  He was exercising fairly regularly but admits he has dropped off plans to get back on track in fact he bought a treadmill.  He also received his Covid vaccinations.  He is due for lab work and is planning on going today.   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   Past Medical History:  Diagnosis Date  . Anxiety   . Atrial fibrillation (HCC) 05/04/2015  . 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 Dyslipidemia   . Hypertension   . Obesity     Past Surgical History:  Procedure Laterality Date  . APPENDECTOMY    . ectinartial colon resection     partial  . HERNIA REPAIR    . HIP SURGERY    . TOTAL HIP ARTHROPLASTY Right 04/20/2013   5/5/20Dr. Doristine Counter    Family History  Problem Relation Age of Onset  . Hypertension Father   . Alcohol abuse Father   . Heart disease Father        CVA  . Stroke Father   . Heart attack Other   . Hypertension Mother     Social History   Socioeconomic History  . Marital status: Single    Spouse name: Not on file  . Number of children: Not on file  . Years of education: Not on file  . Highest education level: Not on file  Occupational History  . Occupation: Magazine features editor: [employer]  Tobacco Use  . Smoking status: Former Smoker    Packs/day: 1.00    Years: 30.00    Pack years: 30.00    Types: Cigarettes    Start date: 07/24/2012  . Smokeless tobacco: Never Used  Substance and Sexual Activity  . Alcohol use: Yes    Alcohol/week: 3.0 standard drinks    Types: 3 Standard drinks or equivalent per week    Comment: per week  . Drug use: No  . Sexual activity: Not on  file  Other Topics Concern  . Not on file  Social History Narrative   No regular exercise.  Large amt of caffeine.    Social Determinants of Health   Financial Resource Strain:   . Difficulty of Paying Living Expenses:   Food Insecurity:   . Worried About Programme researcher, broadcasting/film/video in the Last Year:   . Barista in the Last Year:   Transportation Needs:   . Freight forwarder (Medical):   Marland Kitchen Lack of Transportation (Non-Medical):   Physical Activity:   . Days of Exercise per Week:   . Minutes of Exercise per Session:   Stress:   . Feeling of Stress :   Social Connections:   . Frequency of Communication with Friends and Family:   . Frequency of Social Gatherings with Friends and Family:   . Attends Religious Services:   . Active Member of Clubs or Organizations:   . Attends Banker Meetings:   Marland Kitchen Marital Status:   Intimate Partner Violence:   . Fear of Current or Ex-Partner:   .  Emotionally Abused:   Marland Kitchen Physically Abused:   . Sexually Abused:     Outpatient Medications Prior to Visit  Medication Sig Dispense Refill  . allopurinol (ZYLOPRIM) 300 MG tablet Take 1 tablet (300 mg total) by mouth daily. 90 tablet 1  . colchicine 0.6 MG tablet TAKE 1 TABLET (0.6 MG TOTAL) BY MOUTH DAILY 90 tablet 1  . Insulin Pen Needle (PEN NEEDLES) 30G X 8 MM MISC 1 each by Does not apply route daily. Use to inject Victoza once daily. 100 each 1  . metFORMIN (GLUCOPHAGE) 500 MG tablet TAKE 1 TABLET (500 MG TOTAL) BY MOUTH 2 (TWO) TIMES DAILY WITH A MEAL 180 tablet 1  . XARELTO 20 MG TABS tablet Take 1 tablet (20 mg total) by mouth daily. 90 tablet 3  . diazepam (VALIUM) 5 MG tablet Take 0.5-1 tablets (2.5-5 mg total) by mouth daily as needed for anxiety. 20 tablet 0  . metaxalone (SKELAXIN) 800 MG tablet TAKE 1 TABLET (800 MG TOTAL) BY MOUTH 2 (TWO) TIMES DAILY AS NEEDED FOR MUSCLE SPASMS. 40 tablet 1  . metoprolol tartrate (LOPRESSOR) 50 MG tablet TAKE 1 TABLET BY MOUTH TWICE A  DAY 180 tablet 1  . sertraline (ZOLOFT) 100 MG tablet Take 2 tablets (200 mg total) by mouth daily. 180 tablet 1  . VICTOZA 18 MG/3ML SOPN INJECT 1.8 MG UNDER THE SKIN ONCE DAILY 27 mL 0   No facility-administered medications prior to visit.    Allergies  Allergen Reactions  . Warfarin Sodium     Other reaction(s): Unknown  . Celexa [Citalopram Hydrobromide] Other (See Comments)    Weight gain  . Pravastatin Other (See Comments)    Joint aches  . Simvastatin Other (See Comments)    Joint aches    ROS Review of Systems    Objective:    Physical Exam  Constitutional: He is oriented to person, place, and time. He appears well-developed and well-nourished.  HENT:  Head: Normocephalic and atraumatic.  Right Ear: External ear normal.  Left Ear: External ear normal.  Nose: Nose normal.  Mouth/Throat: Oropharynx is clear and moist.  Eyes: Pupils are equal, round, and reactive to light. Conjunctivae and EOM are normal.  Neck: No thyromegaly present.  Cardiovascular: Normal rate, regular rhythm, normal heart sounds and intact distal pulses.  Pulmonary/Chest: Effort normal and breath sounds normal.  Abdominal: Soft. Bowel sounds are normal. He exhibits no distension and no mass. There is no abdominal tenderness. There is no rebound and no guarding.  Musculoskeletal:        General: Normal range of motion.     Cervical back: Normal range of motion and neck supple.  Lymphadenopathy:    He has no cervical adenopathy.  Neurological: He is alert and oriented to person, place, and time. He has normal reflexes.  Skin: Skin is warm and dry.  Psychiatric: He has a normal mood and affect. His behavior is normal. Judgment and thought content normal.    BP 106/75   Pulse (!) 56   Ht 5\' 11"  (1.803 m)   Wt (!) 314 lb (142.4 kg)   SpO2 98%   BMI 43.79 kg/m  Wt Readings from Last 3 Encounters:  03/22/20 (!) 314 lb (142.4 kg)  07/28/19 (!) 313 lb (142 kg)  07/15/19 (!) 316 lb (143.3 kg)      There are no preventive care reminders to display for this patient.  There are no preventive care reminders to display for this patient.  Lab Results  Component Value Date   TSH 0.84 10/11/2017   Lab Results  Component Value Date   WBC 9.0 08/15/2018   HGB 13.7 08/15/2018   HCT 39.4 08/15/2018   MCV 91.8 08/15/2018   PLT 268 08/15/2018   Lab Results  Component Value Date   NA 137 02/05/2019   K 4.2 02/05/2019   CO2 23 02/05/2019   GLUCOSE 95 02/05/2019   BUN 12 02/05/2019   CREATININE 0.70 02/05/2019   BILITOT 0.7 02/05/2019   ALKPHOS 72 07/09/2017   AST 21 02/05/2019   ALT 22 02/05/2019   PROT 7.2 02/05/2019   ALBUMIN 4.1 07/09/2017   CALCIUM 9.5 02/05/2019   Lab Results  Component Value Date   CHOL 312 (H) 07/15/2019   Lab Results  Component Value Date   HDL 47 07/15/2019   Lab Results  Component Value Date   LDLCALC  07/15/2019     Comment:     . LDL cholesterol not calculated. Triglyceride levels greater than 400 mg/dL invalidate calculated LDL results. . Reference range: <100 . Desirable range <100 mg/dL for primary prevention;   <70 mg/dL for patients with CHD or diabetic patients  with > or = 2 CHD risk factors. Marland Kitchen LDL-C is now calculated using the Martin-Hopkins  calculation, which is a validated novel method providing  better accuracy than the Friedewald equation in the  estimation of LDL-C.  Cresenciano Genre et al. Annamaria Helling. 6440;347(42): 2061-2068  (http://education.[facility].com/faq/FAQ164)    Lab Results  Component Value Date   TRIG 447 (H) 07/15/2019   Lab Results  Component Value Date   CHOLHDL 6.6 (H) 07/15/2019   Lab Results  Component Value Date   HGBA1C 5.9 (H) 07/15/2019      Assessment & Plan:   Problem List Items Addressed This Visit    None    Visit Diagnoses    Routine general medical examination at a health care facility    -  Primary   Relevant Orders   HgB A1c   Lipid panel   COMPLETE METABOLIC PANEL  WITH GFR   PSA   Uric acid   Atrial fibrillation, unspecified type ([place])       Relevant Medications   metoprolol tartrate (LOPRESSOR) 50 MG tablet     Keep up a regular exercise program and make sure you are eating a healthy diet Try to eat 4 servings of dairy a day, or if you are lactose intolerant take a calcium with vitamin D daily.  Your vaccines are up to date.    Meds ordered this encounter  Medications  . metoprolol tartrate (LOPRESSOR) 50 MG tablet    Sig: Take 1 tablet (50 mg total) by mouth 2 (two) times daily.    Dispense:  180 tablet    Refill:  1  . sertraline (ZOLOFT) 100 MG tablet    Sig: Take 2 tablets (200 mg total) by mouth daily.    Dispense:  180 tablet    Refill:  1  . liraglutide (VICTOZA) 18 MG/3ML SOPN    Sig: INJECT 1.8 MG UNDER THE SKIN ONCE DAILY    Dispense:  27 mL    Refill:  3    Follow-up: Return in about 4 months (around 07/22/2020) for Diabetes follow-up.    Beatrice Lecher, MD

## 2020-03-22 NOTE — Patient Instructions (Signed)
Preventive Care 41-56 Years Old, Male Preventive care refers to lifestyle choices and visits with your health care provider that can promote health and wellness. This includes:  A yearly physical exam. This is also called an annual well check.  Regular dental and eye exams.  Immunizations.  Screening for certain conditions.  Healthy lifestyle choices, such as eating a healthy diet, getting regular exercise, not using drugs or products that contain nicotine and tobacco, and limiting alcohol use. What can I expect for my preventive care visit? Physical exam Your health care provider will check:  Height and weight. These may be used to calculate body mass index (BMI), which is a measurement that tells if you are at a healthy weight.  Heart rate and blood pressure.  Your skin for abnormal spots. Counseling Your health care provider may ask you questions about:  Alcohol, tobacco, and drug use.  Emotional well-being.  Home and relationship well-being.  Sexual activity.  Eating habits.  Work and work Statistician. What immunizations do I need?  Influenza (flu) vaccine  This is recommended every year. Tetanus, diphtheria, and pertussis (Tdap) vaccine  You may need a Td booster every 10 years. Varicella (chickenpox) vaccine  You may need this vaccine if you have not already been vaccinated. Zoster (shingles) vaccine  You may need this after age 64. Measles, mumps, and rubella (MMR) vaccine  You may need at least one dose of MMR if you were born in 1957 or later. You may also need a second dose. Pneumococcal conjugate (PCV13) vaccine  You may need this if you have certain conditions and were not previously vaccinated. Pneumococcal polysaccharide (PPSV23) vaccine  You may need one or two doses if you smoke cigarettes or if you have certain conditions. Meningococcal conjugate (MenACWY) vaccine  You may need this if you have certain conditions. Hepatitis A  vaccine  You may need this if you have certain conditions or if you travel or work in places where you may be exposed to hepatitis A. Hepatitis B vaccine  You may need this if you have certain conditions or if you travel or work in places where you may be exposed to hepatitis B. Haemophilus influenzae type b (Hib) vaccine  You may need this if you have certain risk factors. Human papillomavirus (HPV) vaccine  If recommended by your health care provider, you may need three doses over 6 months. You may receive vaccines as individual doses or as more than one vaccine together in one shot (combination vaccines). Talk with your health care provider about the risks and benefits of combination vaccines. What tests do I need? Blood tests  Lipid and cholesterol levels. These may be checked every 5 years, or more frequently if you are over 60 years old.  Hepatitis C test.  Hepatitis B test. Screening  Lung cancer screening. You may have this screening every year starting at age 43 if you have a 30-pack-year history of smoking and currently smoke or have quit within the past 15 years.  Prostate cancer screening. Recommendations will vary depending on your family history and other risks.  Colorectal cancer screening. All adults should have this screening starting at age 72 and continuing until age 2. Your health care provider may recommend screening at age 14 if you are at increased risk. You will have tests every 1-10 years, depending on your results and the type of screening test.  Diabetes screening. This is done by checking your blood sugar (glucose) after you have not eaten  for a while (fasting). You may have this done every 1-3 years.  Sexually transmitted disease (STD) testing. Follow these instructions at home: Eating and drinking  Eat a diet that includes fresh fruits and vegetables, whole grains, lean protein, and low-fat dairy products.  Take vitamin and mineral supplements as  recommended by your health care provider.  Do not drink alcohol if your health care provider tells you not to drink.  If you drink alcohol: ? Limit how much you have to 0-2 drinks a day. ? Be aware of how much alcohol is in your drink. In the [place] one drink equals one 12 oz bottle of beer (355 mL), one 5 oz glass of wine (148 mL), or one 1 oz glass of hard liquor (44 mL). Lifestyle  Take daily care of your teeth and gums.  Stay active. Exercise for at least 30 minutes on 5 or more days each week.  Do not use any products that contain nicotine or tobacco, such as cigarettes, e-cigarettes, and chewing tobacco. If you need help quitting, ask your health care provider.  If you are sexually active, practice safe sex. Use a condom or other form of protection to prevent STIs (sexually transmitted infections).  Talk with your health care provider about taking a low-dose aspirin every day starting at age 53. What's next?  Go to your health care provider once a year for a well check visit.  Ask your health care provider how often you should have your eyes and teeth checked.  Stay up to date on all vaccines. This information is not intended to replace advice given to you by your health care provider. Make sure you discuss any questions you have with your health care provider. Document Revised: 11/27/2018 Document Reviewed: 11/27/2018 Elsevier Patient Education  2020 [employer].

## 2020-03-23 LAB — HEMOGLOBIN A1C
Hgb A1c MFr Bld: 5.6 % of total Hgb (ref <5.7)
Mean Plasma Glucose: 114 (calc)
eAG (mmol/L): 6.3 (calc)

## 2020-03-23 LAB — LIPID PANEL
Cholesterol: 302 mg/dL — ABNORMAL HIGH (ref <200)
HDL: 43 mg/dL (ref >=40)
LDL Cholesterol (Calc): 194 mg/dL (calc) — ABNORMAL HIGH
Non-HDL Cholesterol (Calc): 259 mg/dL (calc) — ABNORMAL HIGH (ref <130)
Total CHOL/HDL Ratio: 7 (calc) — ABNORMAL HIGH (ref <5.0)
Triglycerides: 384 mg/dL — ABNORMAL HIGH (ref <150)

## 2020-03-23 LAB — COMPLETE METABOLIC PANEL WITH GFR
AG Ratio: 1.5 (calc) (ref 1.0–2.5)
ALT: 41 U/L (ref 9–46)
AST: 31 U/L (ref 10–35)
Albumin: 4.4 g/dL (ref 3.6–5.1)
Alkaline phosphatase (APISO): 64 U/L (ref 35–144)
BUN: 14 mg/dL (ref 7–25)
CO2: 24 mmol/L (ref 20–32)
Calcium: 9.6 mg/dL (ref 8.6–10.3)
Chloride: 102 mmol/L (ref 98–110)
Creat: 0.81 mg/dL (ref 0.70–1.33)
GFR, Est African American: 115 mL/min/{1.73_m2} (ref >=60)
GFR, Est Non African American: 99 mL/min/{1.73_m2} (ref >=60)
Globulin: 3 g/dL (calc) (ref 1.9–3.7)
Glucose, Bld: 96 mg/dL (ref 65–99)
Potassium: 4.5 mmol/L (ref 3.5–5.3)
Sodium: 136 mmol/L (ref 135–146)
Total Bilirubin: 0.5 mg/dL (ref 0.2–1.2)
Total Protein: 7.4 g/dL (ref 6.1–8.1)

## 2020-03-23 LAB — URIC ACID: Uric Acid, Serum: 7 mg/dL (ref 4.0–8.0)

## 2020-03-23 LAB — PSA: PSA: 0.4 ng/mL (ref <=4.0)

## 2020-03-26 ENCOUNTER — Encounter: Payer: Self-pay | Admitting: Family Medicine

## 2020-03-26 DIAGNOSIS — E782 Mixed hyperlipidemia: Secondary | ICD-10-CM

## 2020-03-28 MED ORDER — ATORVASTATIN CALCIUM 10 MG PO TABS: 10.0000 mg | ORAL_TABLET | ORAL | 3 refills | Status: DC

## 2020-04-10 ENCOUNTER — Other Ambulatory Visit: Payer: Self-pay | Admitting: Family Medicine

## 2020-04-10 DIAGNOSIS — M10071 Idiopathic gout, right ankle and foot: Secondary | ICD-10-CM

## 2020-04-10 DIAGNOSIS — M1A0710 Idiopathic chronic gout, right ankle and foot, without tophus (tophi): Secondary | ICD-10-CM

## 2020-04-18 ENCOUNTER — Other Ambulatory Visit: Payer: Self-pay | Admitting: Family Medicine

## 2020-08-09 ENCOUNTER — Other Ambulatory Visit: Payer: Self-pay | Admitting: Family Medicine

## 2020-09-09 ENCOUNTER — Encounter: Payer: Self-pay | Admitting: Family Medicine

## 2020-09-09 NOTE — Telephone Encounter (Signed)
OK for letter. Thank you

## 2020-09-12 ENCOUNTER — Telehealth: Payer: Self-pay | Admitting: Family Medicine

## 2020-09-12 NOTE — Telephone Encounter (Signed)
Dropped off paperwork to be filled out. PT has an appointment on 09/15/20.

## 2020-09-15 ENCOUNTER — Encounter: Payer: Self-pay | Admitting: Family Medicine

## 2020-09-15 ENCOUNTER — Ambulatory Visit (INDEPENDENT_AMBULATORY_CARE_PROVIDER_SITE_OTHER): Payer: BC Managed Care – PPO | Admitting: Family Medicine

## 2020-09-15 VITALS — BP 135/88 | HR 93 | Wt 322.0 lb

## 2020-09-15 DIAGNOSIS — Z23 Encounter for immunization: Secondary | ICD-10-CM

## 2020-09-15 DIAGNOSIS — M1A0710 Idiopathic chronic gout, right ankle and foot, without tophus (tophi): Secondary | ICD-10-CM

## 2020-09-15 DIAGNOSIS — J984 Other disorders of lung: Secondary | ICD-10-CM | POA: Diagnosis not present

## 2020-09-15 DIAGNOSIS — R7301 Impaired fasting glucose: Secondary | ICD-10-CM | POA: Diagnosis not present

## 2020-09-15 DIAGNOSIS — R0602 Shortness of breath: Secondary | ICD-10-CM

## 2020-09-15 DIAGNOSIS — I4891 Unspecified atrial fibrillation: Secondary | ICD-10-CM

## 2020-09-15 LAB — BASIC METABOLIC PANEL WITH GFR
BUN: 12 mg/dL (ref 7–25)
CO2: 24 mmol/L (ref 20–32)
Calcium: 9.7 mg/dL (ref 8.6–10.3)
Chloride: 105 mmol/L (ref 98–110)
Creat: 0.82 mg/dL (ref 0.70–1.33)
GFR, Est African American: 115 mL/min/{1.73_m2} (ref >=60)
GFR, Est Non African American: 99 mL/min/{1.73_m2} (ref >=60)
Glucose, Bld: 98 mg/dL (ref 65–99)
Potassium: 4.6 mmol/L (ref 3.5–5.3)
Sodium: 138 mmol/L (ref 135–146)

## 2020-09-15 LAB — POCT GLYCOSYLATED HEMOGLOBIN (HGB A1C): Hemoglobin A1C: 5.8 % — AB (ref 4.0–5.6)

## 2020-09-15 NOTE — Progress Notes (Signed)
Established Patient Office Visit  Subjective:  Patient ID: Albert Humphrey, male    DOB: 04-30-1966  Age: 54 y.o. MRN: [id number]  CC:  Chief Complaint  Patient presents with  . IFG    fasting  . Gout    HPI Albert Humphrey presents for   Impaired fasting glucose-no increased thirst or urination. No symptoms consistent with hypoglycemia.  Follow-up A. fib-overall he does well he just gets short of breath with activity so he is not sure if that is related to his lungs or the A. fib.  He reports he takes his Xarelto regularly but has stopped his statin again.  He was worried about potential long-term side effects.  Also wanted to discuss wearing a mask at work  He can wear is when sedentary.  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 Would like accomodation form completed.   Past Medical History:  Diagnosis Date  . Anxiety   . Atrial fibrillation (HCC) 05/04/2015  . 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 Dyslipidemia   . Hypertension   . Obesity     Past Surgical History:  Procedure Laterality Date  . APPENDECTOMY    . ectinartial colon resection     partial  . HERNIA REPAIR    . HIP SURGERY    . TOTAL HIP ARTHROPLASTY Right 04/20/2013   5/5/20Dr. Doristine Counter    Family History  Problem Relation Age of Onset  . Hypertension Father   . Alcohol abuse Father   . Heart disease Father        CVA  . Stroke Father   . Heart attack Other   . Hypertension Mother     Social History   Socioeconomic History  . Marital status: Single    Spouse name: Not on file  . Number of children: Not on file  . Years of education: Not on file  . Highest education level: Not on file  Occupational History  . Occupation: Magazine features editor: [employer]  Tobacco Use  . Smoking status: Former Smoker    Packs/day: 1.00    Years: 30.00    Pack years: 30.00    Types: Cigarettes    Start date: 07/24/2012  . Smokeless tobacco: Never Used   Substance and Sexual Activity  . Alcohol use: Yes    Alcohol/week: 3.0 standard drinks    Types: 3 Standard drinks or equivalent per week    Comment: per week  . Drug use: No  . Sexual activity: Not on file  Other Topics Concern  . Not on file  Social History Narrative   No regular exercise.  Large amt of caffeine.    Social Determinants of Health   Financial Resource Strain:   . Difficulty of Paying Living Expenses: Not on file  Food Insecurity:   . Worried About Programme researcher, broadcasting/film/video in the Last Year: Not on file  . Ran Out of Food in the Last Year: Not on file  Transportation Needs:   . Lack of Transportation (Medical): Not on file  . Lack of Transportation (Non-Medical): Not on file  Physical Activity:   . Days of Exercise per Week: Not on file  . Minutes of Exercise per Session: Not on file  Stress:   . Feeling of Stress : Not on file  Social Connections:   . Frequency of Communication with Friends and Family: Not  on file  . Frequency of Social Gatherings with Friends and Family: Not on file  . Attends Religious Services: Not on file  . Active Member of Clubs or Organizations: Not on file  . Attends Banker Meetings: Not on file  . Marital Status: Not on file  Intimate Partner Violence:   . Fear of Current or Ex-Partner: Not on file  . Emotionally Abused: Not on file  . Physically Abused: Not on file  . Sexually Abused: Not on file    Outpatient Medications Prior to Visit  Medication Sig Dispense Refill  . allopurinol (ZYLOPRIM) 300 MG tablet TAKE 1 TABLET BY MOUTH EVERY DAY 90 tablet 1  . B-D ULTRAFINE III SHORT PEN 31G X 8 MM MISC 1 EACH BY DOES NOT APPLY ROUTE DAILY. USE TO INJECT VICTOZA ONCE DAILY. 100 each 1  . liraglutide (VICTOZA) 18 MG/3ML SOPN INJECT 1.8 MG UNDER THE SKIN ONCE DAILY 27 mL 3  . metFORMIN (GLUCOPHAGE) 500 MG tablet Take 1 tablet (500 mg total) by mouth 2 (two) times daily with a meal. appt for refills 180 tablet 0  . metoprolol  tartrate (LOPRESSOR) 50 MG tablet Take 1 tablet (50 mg total) by mouth 2 (two) times daily. 180 tablet 1  . sertraline (ZOLOFT) 100 MG tablet Take 2 tablets (200 mg total) by mouth daily. 180 tablet 1  . XARELTO 20 MG TABS tablet Take 1 tablet (20 mg total) by mouth daily. 90 tablet 3  . atorvastatin (LIPITOR) 10 MG tablet Take 1 tablet (10 mg total) by mouth every other day. At Bedtime 45 tablet 3  . colchicine 0.6 MG tablet TAKE 1 TABLET (0.6 MG TOTAL) BY MOUTH DAILY 90 tablet 1   No facility-administered medications prior to visit.    Allergies  Allergen Reactions  . Warfarin Sodium     Other reaction(s): Unknown  . Celexa [Citalopram Hydrobromide] Other (See Comments)    Weight gain  . Pravastatin Other (See Comments)    Joint aches  . Simvastatin Other (See Comments)    Joint aches    ROS Review of Systems    Objective:    Physical Exam Vitals reviewed.  Constitutional:      Appearance: He is well-developed.  HENT:     Head: Normocephalic and atraumatic.  Eyes:     Conjunctiva/sclera: Conjunctivae normal.  Cardiovascular:     Rate and Rhythm: Normal rate and regular rhythm.     Heart sounds: Normal heart sounds.  Pulmonary:     Effort: Pulmonary effort is normal.     Breath sounds: Normal breath sounds.  Skin:    General: Skin is warm and dry.     Coloration: Skin is not pale.  Neurological:     Mental Status: He is alert and oriented to person, place, and time.  Psychiatric:        Behavior: Behavior normal.     BP 135/88   Pulse 93   Wt (!) 322 lb (146.1 kg)   BMI 44.91 kg/m  Wt Readings from Last 3 Encounters:  09/15/20 (!) 322 lb (146.1 kg)  03/22/20 (!) 314 lb (142.4 kg)  07/28/19 (!) 313 lb (142 kg)     There are no preventive care reminders to display for this patient.  There are no preventive care reminders to display for this patient.  Lab Results  Component Value Date   TSH 0.84 10/11/2017   Lab Results  Component Value Date    WBC 9.0  08/15/2018   HGB 13.7 08/15/2018   HCT 39.4 08/15/2018   MCV 91.8 08/15/2018   PLT 268 08/15/2018   Lab Results  Component Value Date   NA 136 03/22/2020   K 4.5 03/22/2020   CO2 24 03/22/2020   GLUCOSE 96 03/22/2020   BUN 14 03/22/2020   CREATININE 0.81 03/22/2020   BILITOT 0.5 03/22/2020   ALKPHOS 72 07/09/2017   AST 31 03/22/2020   ALT 41 03/22/2020   PROT 7.4 03/22/2020   ALBUMIN 4.1 07/09/2017   CALCIUM 9.6 03/22/2020   Lab Results  Component Value Date   CHOL 302 (H) 03/22/2020   Lab Results  Component Value Date   HDL 43 03/22/2020   Lab Results  Component Value Date   LDLCALC 194 (H) 03/22/2020   Lab Results  Component Value Date   TRIG 384 (H) 03/22/2020   Lab Results  Component Value Date   CHOLHDL 7.0 (H) 03/22/2020   Lab Results  Component Value Date   HGBA1C 5.8 (A) 09/15/2020      Assessment & Plan:   Problem List Items Addressed This Visit      Cardiovascular and Mediastinum   Atrial fibrillation (HCC)    Continue Xarelto daily.  Due for refill on medications.  Strongly encouraged him to restart his statin.        Respiratory   Restrictive lung disease    He is having difficulty wearing the mask constantly at work mostly when he is just up and walking.  He does have restrictive lung disease so we will complete accommodation form for him to be able to take a break from his mass for about 10 to 15 minutes at least hourly especially with activity.        Endocrine   IFG (impaired fasting glucose) - Primary    A1c up a little bit to 5.8 this time but otherwise doing well with the Metformin and the Victoza.  Just encouraged him to continue work on healthy diet regular exercise as well as portion control especially as we move into the holidays.  I will see him back in 6 months.      Relevant Orders   POCT glycosylated hemoglobin (Hb A1C) (Completed)   BASIC METABOLIC PANEL WITH GFR     Other   Shortness of breath     Accomodation form completed.       Obesity, Class III, BMI 40-49.9 (morbid obesity) (HCC)    Weight is up from last OV. Encouraged to continue to work on healthy dietary choices.       Gout    Other Visit Diagnoses    Flu vaccine need       Relevant Orders   Flu Vaccine QUAD 36+ mos IM (Completed)      No orders of the defined types were placed in this encounter.   Follow-up: Return in about 6 months (around 03/15/2021) for glucose .    Nani Gasser, MD

## 2020-09-15 NOTE — Assessment & Plan Note (Signed)
Accomodation form completed.

## 2020-09-15 NOTE — Assessment & Plan Note (Signed)
He is having difficulty wearing the mask constantly at work mostly when he is just up and walking.  He does have restrictive lung disease so we will complete accommodation form for him to be able to take a break from his mass for about 10 to 15 minutes at least hourly especially with activity.

## 2020-09-15 NOTE — Assessment & Plan Note (Signed)
A1c up a little bit to 5.8 this time but otherwise doing well with the Metformin and the Victoza.  Just encouraged him to continue work on healthy diet regular exercise as well as portion control especially as we move into the holidays.  I will see him back in 6 months.

## 2020-09-15 NOTE — Assessment & Plan Note (Signed)
Weight is up from last OV. Encouraged to continue to work on healthy dietary choices.

## 2020-09-15 NOTE — Assessment & Plan Note (Signed)
Continue Xarelto daily.  Due for refill on medications.  Strongly encouraged him to restart his statin.

## 2020-09-16 NOTE — Progress Notes (Signed)
All labs are normal. 

## 2020-10-11 ENCOUNTER — Other Ambulatory Visit: Payer: Self-pay | Admitting: Family Medicine

## 2020-10-11 DIAGNOSIS — I4891 Unspecified atrial fibrillation: Secondary | ICD-10-CM

## 2020-10-14 ENCOUNTER — Other Ambulatory Visit: Payer: Self-pay | Admitting: Family Medicine

## 2020-10-14 DIAGNOSIS — M1A0710 Idiopathic chronic gout, right ankle and foot, without tophus (tophi): Secondary | ICD-10-CM

## 2020-10-14 DIAGNOSIS — M10071 Idiopathic gout, right ankle and foot: Secondary | ICD-10-CM

## 2020-10-22 ENCOUNTER — Other Ambulatory Visit: Payer: Self-pay | Admitting: Family Medicine

## 2020-10-31 ENCOUNTER — Encounter: Payer: Self-pay | Admitting: Nurse Practitioner

## 2020-10-31 ENCOUNTER — Telehealth (INDEPENDENT_AMBULATORY_CARE_PROVIDER_SITE_OTHER): Payer: BC Managed Care – PPO | Admitting: Nurse Practitioner

## 2020-10-31 DIAGNOSIS — M549 Dorsalgia, unspecified: Secondary | ICD-10-CM

## 2020-10-31 DIAGNOSIS — R059 Cough, unspecified: Secondary | ICD-10-CM | POA: Diagnosis not present

## 2020-10-31 DIAGNOSIS — R062 Wheezing: Secondary | ICD-10-CM

## 2020-10-31 MED ORDER — AZITHROMYCIN 250 MG PO TABS: ORAL_TABLET | ORAL | 0 refills | Status: DC

## 2020-10-31 MED ORDER — PREDNISONE 20 MG PO TABS: 40.0000 mg | ORAL_TABLET | Freq: Every day | ORAL | 0 refills | Status: AC

## 2020-10-31 MED ORDER — TRAMADOL HCL 50 MG PO TABS: ORAL_TABLET | ORAL | 0 refills | Status: DC

## 2020-10-31 NOTE — Progress Notes (Signed)
Virtual Video Visit via MyChart Note  I connected with  Burr Medico on 10/31/20 at  2:50 PM EST by the video enabled telemedicine application for , MyChart, and verified that I am speaking with the correct person using two identifiers.   I introduced myself as a Publishing rights manager with the practice. We discussed the limitations of evaluation and management by telemedicine and the availability of in person appointments. The patient expressed understanding and agreed to proceed.  The patient is: in his car I am: in the office  Subjective:    CC:  Chief Complaint  Patient presents with  . 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 y/o male presenting via MyChart today for wheezing, chest congestion, shortness of breath, discomfort in upper back "around the top of my lungs" for the past several days. He reports that he was exposed to his mother and sister who both have pneumonia and are on antibiotic therapy for their symptoms, he started developing symptoms a few days later.   He has been using DM cough syrup, but that has not been helpful. He reports the upper back congestion/pain is the worst symptom.   He denies known fever, nausea, vomiting, diarrhea, sinus congestion, productive cough.   He reports that he has had similar symptoms in the past that required antibiotic therapy and he is afraid that he will need this again. He has used albuterol in the past and states that it does not seem to be helpful.   Past medical history, Surgical history, Family history not pertinant except as noted below, Social history, Allergies, and medications have been entered into the medical record, reviewed, and corrections made.   Review of Systems:  See HPI for pertinent  positive and negatives  Objective:    General: Speaking clearly in complete sentences. Mild audible congestion noted. No shortness of breath at rest.  Alert and oriented x3.   Normal judgment.  No apparent acute distress.   Impression and Recommendations:    1. Cough 2. Wheezing 3. Upper back pain Symptoms and presentation consistent with upper respiratory type infection.  Given his recent exposures and the length of time he has been feeling poorly we will begin antibiotic therapy today with azithromycin.   Also plan for treatment with oral prednisone burst for wheezing. Tramadol provided for pain-recommend trialing Tylenol for pain relief first.  Discussed with the patient that if the symptoms worsen or fail to improve towards the end of this week to please contact me to let me know.  At that time we will need to obtain a chest x-ray for closer evaluation. - azithromycin (ZITHROMAX) 250 MG tablet; Take 2 tabs (500 mg) together on the first day, then 1 tab (250 mg) daily until prescription complete.  Dispense: 10 tablet; Refill: 0 - predniSONE (DELTASONE) 20 MG tablet; Take 2 tablets (40 mg total) by mouth daily with breakfast for 5 days.  Dispense: 10 tablet; Refill: 0 - traMADol (ULTRAM) 50 MG tablet; 1-2 tabs by mouth every 6 hours for pain.  Dispense: 30 tablet; Refill: 0     I discussed the assessment and treatment plan with the patient. The patient was provided an opportunity to ask questions and all were  answered. The patient agreed with the plan and demonstrated an understanding of the instructions.   The patient was advised to call back or seek an in-person evaluation if the symptoms worsen or if the condition fails to improve as anticipated.  I provided 20 minutes of non-face-to-face interaction with this MYCHART visit including intake, same-day documentation, and chart review.   Tollie Eth, NP

## 2020-10-31 NOTE — Patient Instructions (Signed)

## 2020-11-14 ENCOUNTER — Other Ambulatory Visit: Payer: Self-pay | Admitting: Family Medicine

## 2020-12-07 ENCOUNTER — Telehealth (INDEPENDENT_AMBULATORY_CARE_PROVIDER_SITE_OTHER): Payer: BC Managed Care – PPO | Admitting: Medical-Surgical

## 2020-12-07 ENCOUNTER — Ambulatory Visit (INDEPENDENT_AMBULATORY_CARE_PROVIDER_SITE_OTHER): Payer: BC Managed Care – PPO

## 2020-12-07 ENCOUNTER — Encounter: Payer: Self-pay | Admitting: Medical-Surgical

## 2020-12-07 DIAGNOSIS — R062 Wheezing: Secondary | ICD-10-CM | POA: Diagnosis not present

## 2020-12-07 DIAGNOSIS — R059 Cough, unspecified: Secondary | ICD-10-CM | POA: Diagnosis not present

## 2020-12-07 DIAGNOSIS — R0602 Shortness of breath: Secondary | ICD-10-CM | POA: Diagnosis not present

## 2020-12-07 DIAGNOSIS — M549 Dorsalgia, unspecified: Secondary | ICD-10-CM

## 2020-12-07 DIAGNOSIS — J984 Other disorders of lung: Secondary | ICD-10-CM | POA: Diagnosis not present

## 2020-12-07 DIAGNOSIS — M546 Pain in thoracic spine: Secondary | ICD-10-CM | POA: Diagnosis not present

## 2020-12-07 MED ORDER — TRAMADOL HCL 50 MG PO TABS
50.0000 mg | ORAL_TABLET | Freq: Three times a day (TID) | ORAL | 0 refills | Status: AC | PRN
Start: 2020-12-07 — End: 2020-12-12

## 2020-12-07 MED ORDER — PREDNISONE 50 MG PO TABS: 50.0000 mg | ORAL_TABLET | Freq: Every day | ORAL | 0 refills | Status: DC

## 2020-12-07 MED ORDER — AMOXICILLIN-POT CLAVULANATE 875-125 MG PO TABS: 1.0000 | ORAL_TABLET | Freq: Two times a day (BID) | ORAL | 0 refills | Status: DC

## 2020-12-07 NOTE — Progress Notes (Signed)
Virtual Visit via Video Note  I connected with Albert Humphrey on 12/07/20 at 10:10 AM EST by a video enabled telemedicine application and verified that I am speaking with the correct person using two identifiers.   I discussed the limitations of evaluation and management by telemedicine and the availability of in person appointments. The patient expressed understanding and agreed to proceed.  Patient location: home Provider locations: office  Subjective:    CC: cough, back pain  HPI: Pleasant 54 year old male presenting with complaints of cough productive of light yellow sputum and upper thoracic back pain bilaterally. Notes the back pain is dull, constant and is unable to identify any exacerbating or alleviating factors. Cough may make it worse but is not sure. No other symptoms including fever, chills, chest pain, worsened shortness of breath, sinus symptoms, headache, or unusual malaise. Had similar symptoms treated 1 month ago with Azithromycin, Prednisone, and prn Tramadol. This regimen helped symptoms fully resolve but now they are back. He does have both diabetes and restrictive lung disease. He is also a former smoker for 30 years at 1 ppd of cigarettes, stopped 9 years ago. Is morbidly obese and knows that weight loss will only help his situation.  Past medical history, Surgical history, Family history not pertinant except as noted below, Social history, Allergies, and medications have been entered into the medical record, reviewed, and corrections made.   Review of Systems: See HPI for pertinent positives and negatives.   Objective:    General: Speaking clearly in complete sentences without any shortness of breath.  Alert and oriented x3.  Normal judgment. No apparent acute distress.  Impression and Recommendations:    1. Cough/wheezing/upper back pain/restrictive lung disease Low suspicion for viral infection. Chest x-ray today, patient to complete this afternoon. Prednisone 50mg   daily x 5 days. With lung disease/diabetes and presenting symptoms, treating empirically with Augmentin BID x 5 days. Tramadol every 8 hours for back pain, use very sparingly. Recommend weight loss. If symptoms do not resolve with this regimen or recur shortly after resolution, will need in person evaluation.  - DG Chest 2 View; Future  I discussed the assessment and treatment plan with the patient. The patient was provided an opportunity to ask questions and all were answered. The patient agreed with the plan and demonstrated an understanding of the instructions.   The patient was advised to call back or seek an in-person evaluation if the symptoms worsen or if the condition fails to improve as anticipated.  20 minutes of non-face-to-face time was provided during this encounter.  Return if symptoms worsen or fail to improve.  , DNP, APRN, FNP-BC [facility]

## 2020-12-08 ENCOUNTER — Telehealth: Payer: Self-pay | Admitting: Neurology

## 2020-12-08 NOTE — Telephone Encounter (Signed)
Prior Authorization for Tramadol submitted via covermymeds. Awaiting response. Your information has been submitted to [employer]. To check for an updated outcome later, reopen this PA request from your dashboard.  If [employer] has not responded to your request within 24 hours, contact [employer] at [phone]. If you think there may be a problem with your PA request, use our live chat feature at the bottom right.

## 2020-12-30 NOTE — Telephone Encounter (Addendum)
Prior authorization approval for Tramadol from 12/08/20 through 06/08/21. [facility] has been updated. PA# [place] health plan [phone] non-grandfathered [id number].

## 2021-01-16 ENCOUNTER — Other Ambulatory Visit: Payer: Self-pay | Admitting: Family Medicine

## 2021-01-16 DIAGNOSIS — I4891 Unspecified atrial fibrillation: Secondary | ICD-10-CM

## 2021-02-18 ENCOUNTER — Other Ambulatory Visit: Payer: Self-pay | Admitting: Family Medicine

## 2021-03-24 ENCOUNTER — Other Ambulatory Visit: Payer: Self-pay | Admitting: Family Medicine

## 2021-03-28 NOTE — Telephone Encounter (Signed)
Please call pt he will need a f/u appt for medication refills,DM,BP and labs

## 2021-03-29 NOTE — Telephone Encounter (Signed)
Called patient to schedule a follow up appt. There were no answer, nor a voice mail to leave a message. I will continue to call back-tvt

## 2021-03-30 ENCOUNTER — Other Ambulatory Visit: Payer: Self-pay | Admitting: *Deleted

## 2021-04-04 NOTE — Telephone Encounter (Signed)
Called pt to schedule an appt. 04/25/2021 in the evening. All completed. tvt

## 2021-04-20 ENCOUNTER — Telehealth: Payer: Self-pay | Admitting: Family Medicine

## 2021-04-20 DIAGNOSIS — M10071 Idiopathic gout, right ankle and foot: Secondary | ICD-10-CM

## 2021-04-20 DIAGNOSIS — I4891 Unspecified atrial fibrillation: Secondary | ICD-10-CM

## 2021-04-20 DIAGNOSIS — E785 Hyperlipidemia, unspecified: Secondary | ICD-10-CM

## 2021-04-20 DIAGNOSIS — R7301 Impaired fasting glucose: Secondary | ICD-10-CM

## 2021-04-20 DIAGNOSIS — Z125 Encounter for screening for malignant neoplasm of prostate: Secondary | ICD-10-CM

## 2021-04-20 NOTE — Telephone Encounter (Signed)
Patient has been made aware. No further questions at this time.  

## 2021-04-20 NOTE — Telephone Encounter (Signed)
Patient wants to know if he can come get his labs so he can do it before his appointment next Tuesday. States that he needs it before because he can not fast all day. Please advise.

## 2021-04-20 NOTE — Telephone Encounter (Signed)
Labs ordererd

## 2021-04-22 LAB — COMPLETE METABOLIC PANEL WITH GFR
AG Ratio: 1.6 (calc) (ref 1.0–2.5)
ALT: 23 U/L (ref 9–46)
AST: 22 U/L (ref 10–35)
Albumin: 4.6 g/dL (ref 3.6–5.1)
Alkaline phosphatase (APISO): 70 U/L (ref 35–144)
BUN: 11 mg/dL (ref 7–25)
CO2: 26 mmol/L (ref 20–32)
Calcium: 9.9 mg/dL (ref 8.6–10.3)
Chloride: 106 mmol/L (ref 98–110)
Creat: 0.77 mg/dL (ref 0.70–1.33)
GFR, Est African American: 117 mL/min/{1.73_m2} (ref >=60)
GFR, Est Non African American: 101 mL/min/{1.73_m2} (ref >=60)
Globulin: 2.8 g/dL (calc) (ref 1.9–3.7)
Glucose, Bld: 88 mg/dL (ref 65–139)
Potassium: 4.3 mmol/L (ref 3.5–5.3)
Sodium: 142 mmol/L (ref 135–146)
Total Bilirubin: 0.7 mg/dL (ref 0.2–1.2)
Total Protein: 7.4 g/dL (ref 6.1–8.1)

## 2021-04-22 LAB — CBC
HCT: 46 % (ref 38.5–50.0)
Hemoglobin: 15.4 g/dL (ref 13.2–17.1)
MCH: 31.4 pg (ref 27.0–33.0)
MCHC: 33.5 g/dL (ref 32.0–36.0)
MCV: 93.7 fL (ref 80.0–100.0)
MPV: 10.8 fL (ref 7.5–12.5)
Platelets: 224 10*3/uL (ref 140–400)
RBC: 4.91 10*6/uL (ref 4.20–5.80)
RDW: 13.1 % (ref 11.0–15.0)
WBC: 6.9 10*3/uL (ref 3.8–10.8)

## 2021-04-22 LAB — HEMOGLOBIN A1C
Hgb A1c MFr Bld: 5.5 % of total Hgb (ref <5.7)
Mean Plasma Glucose: 111 mg/dL
eAG (mmol/L): 6.2 mmol/L

## 2021-04-22 LAB — LIPID PANEL
Cholesterol: 250 mg/dL — ABNORMAL HIGH (ref <200)
HDL: 39 mg/dL — ABNORMAL LOW (ref >=40)
LDL Cholesterol (Calc): 164 mg/dL (calc) — ABNORMAL HIGH
Non-HDL Cholesterol (Calc): 211 mg/dL (calc) — ABNORMAL HIGH (ref <130)
Total CHOL/HDL Ratio: 6.4 (calc) — ABNORMAL HIGH (ref <5.0)
Triglycerides: 285 mg/dL — ABNORMAL HIGH (ref <150)

## 2021-04-22 LAB — PSA: PSA: 0.45 ng/mL (ref <=4.00)

## 2021-04-22 LAB — URIC ACID: Uric Acid, Serum: 8.5 mg/dL — ABNORMAL HIGH (ref 4.0–8.0)

## 2021-04-24 ENCOUNTER — Other Ambulatory Visit: Payer: Self-pay | Admitting: Family Medicine

## 2021-04-24 DIAGNOSIS — M10071 Idiopathic gout, right ankle and foot: Secondary | ICD-10-CM

## 2021-04-24 DIAGNOSIS — M1A0710 Idiopathic chronic gout, right ankle and foot, without tophus (tophi): Secondary | ICD-10-CM

## 2021-04-24 MED ORDER — ALLOPURINOL 300 MG PO TABS: 300.0000 mg | ORAL_TABLET | Freq: Every day | ORAL | 1 refills | Status: DC

## 2021-04-25 ENCOUNTER — Ambulatory Visit: Payer: BC Managed Care – PPO | Admitting: Family Medicine

## 2021-04-25 ENCOUNTER — Encounter: Payer: Self-pay | Admitting: Family Medicine

## 2021-04-25 ENCOUNTER — Other Ambulatory Visit: Payer: Self-pay

## 2021-04-25 VITALS — BP 125/74 | HR 88 | Ht 71.0 in | Wt 288.0 lb

## 2021-04-25 DIAGNOSIS — E785 Hyperlipidemia, unspecified: Secondary | ICD-10-CM

## 2021-04-25 DIAGNOSIS — R7301 Impaired fasting glucose: Secondary | ICD-10-CM

## 2021-04-25 DIAGNOSIS — I4891 Unspecified atrial fibrillation: Secondary | ICD-10-CM | POA: Diagnosis not present

## 2021-04-25 DIAGNOSIS — E782 Mixed hyperlipidemia: Secondary | ICD-10-CM

## 2021-04-25 DIAGNOSIS — M1A0710 Idiopathic chronic gout, right ankle and foot, without tophus (tophi): Secondary | ICD-10-CM

## 2021-04-25 DIAGNOSIS — M10071 Idiopathic gout, right ankle and foot: Secondary | ICD-10-CM | POA: Diagnosis not present

## 2021-04-25 DIAGNOSIS — F419 Anxiety disorder, unspecified: Secondary | ICD-10-CM

## 2021-04-25 MED ORDER — DIAZEPAM 5 MG PO TABS: 2.5000 mg | ORAL_TABLET | Freq: Every day | ORAL | 0 refills | Status: DC | PRN

## 2021-04-25 MED ORDER — ALLOPURINOL 100 MG PO TABS: 100.0000 mg | ORAL_TABLET | Freq: Every day | ORAL | 1 refills | Status: DC

## 2021-04-25 NOTE — Assessment & Plan Note (Signed)
He is asking for refill on diazepam.  He usually gets about 15-20 tabs per year.  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

## 2021-04-25 NOTE — Progress Notes (Signed)
Established Patient Office Visit  Subjective:  Patient ID: Albert Humphrey, male    DOB: Oct 21, 1966  Age: 55 y.o. MRN: [id number]  CC:  Chief Complaint  Patient presents with  . Diabetes  . Hypertension  . Gout    HPI Albert Humphrey presents for   Impaired fasting glucose-no increased thirst or urination. No symptoms consistent with hypoglycemia.  Quit taking his metformin in January.  He is still on Victoza and does feel like it does help curb his appetite.  He started Nutrisystem in January.  Hypertension- Pt denies chest pain, SOB, dizziness, or heart palpitations.  Taking meds as directed w/o problems.  Denies medication side effects.    Gout - he actually quit taking his allopurinol in January.  He has not had any flares or exacerbations.  Hyperlipidemia-quit taking atorvastatin in January.  Past Medical History:  Diagnosis Date  . Anxiety   . Atrial fibrillation (HCC) 05/04/2015  . 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 Dyslipidemia   . Hypertension   . Obesity     Past Surgical History:  Procedure Laterality Date  . APPENDECTOMY    . ectinartial colon resection     partial  . HERNIA REPAIR    . HIP SURGERY    . TOTAL HIP ARTHROPLASTY Right 04/20/2013   5/5/20Dr. Doristine Counter    Family History  Problem Relation Age of Onset  . Hypertension Father   . Alcohol abuse Father   . Heart disease Father        CVA  . Stroke Father   . Heart attack Other   . Hypertension Mother     Social History   Socioeconomic History  . Marital status: Single    Spouse name: Not on file  . Number of children: Not on file  . Years of education: Not on file  . Highest education level: Not on file  Occupational History  . Occupation: Magazine features editor: [employer]  Tobacco Use  . Smoking status: Former Smoker    Packs/day: 1.00    Years: 30.00    Pack years: 30.00    Types: Cigarettes    Start date: 07/24/2012  . Smokeless tobacco: Never Used  Substance and Sexual  Activity  . Alcohol use: Yes    Alcohol/week: 3.0 standard drinks    Types: 3 Standard drinks or equivalent per week    Comment: per week  . Drug use: No  . Sexual activity: Not on file  Other Topics Concern  . Not on file  Social History Narrative   No regular exercise.  Large amt of caffeine.    Social Determinants of Health   Financial Resource Strain: Not on file  Food Insecurity: Not on file  Transportation Needs: Not on file  Physical Activity: Not on file  Stress: Not on file  Social Connections: Not on file  Intimate Partner Violence: Not on file    Outpatient Medications Prior to Visit  Medication Sig Dispense Refill  . B-D ULTRAFINE III SHORT PEN 31G X 8 MM MISC 1 EACH BY DOES NOT APPLY ROUTE DAILY. USE TO INJECT VICTOZA ONCE DAILY. 100 each 1  . metoprolol tartrate (LOPRESSOR) 50 MG tablet TAKE 1 TABLET BY MOUTH TWICE A DAY 180 tablet 1  . sertraline (ZOLOFT) 100 MG tablet TAKE 2 TABLETS BY MOUTH EVERY DAY 180 tablet 1  . VICTOZA 18 MG/3ML SOPN INJECT 1.8 MG UNDER THE SKIN ONCE DAILY 27 mL  3  . XARELTO 20 MG TABS tablet TAKE 1 TABLET BY MOUTH EVERY DAY 90 tablet 3  . allopurinol (ZYLOPRIM) 300 MG tablet Take 1 tablet (300 mg total) by mouth daily. (Patient not taking: Reported on 04/25/2021) 90 tablet 1  . atorvastatin (LIPITOR) 10 MG tablet Take 5 mg by mouth at bedtime. (Patient not taking: Reported on 04/25/2021)    . metFORMIN (GLUCOPHAGE) 500 MG tablet TAKE 1 TABLET (500 MG TOTAL) BY MOUTH 2 (TWO) TIMES DAILY WITH A MEAL. APPT FOR REFILLS (Patient not taking: Reported on 04/25/2021) 180 tablet 1   No facility-administered medications prior to visit.    Allergies  Allergen Reactions  . Warfarin Sodium     Other reaction(s): Unknown  . Celexa [Citalopram Hydrobromide] Other (See Comments)    Weight gain  . Pravastatin Other (See Comments)    Joint aches  . Simvastatin Other (See Comments)    Joint aches    ROS Review of Systems    Objective:     Physical Exam Constitutional:      Appearance: He is well-developed.  HENT:     Head: Normocephalic and atraumatic.  Cardiovascular:     Rate and Rhythm: Normal rate and regular rhythm.     Heart sounds: Normal heart sounds.  Pulmonary:     Effort: Pulmonary effort is normal.     Breath sounds: Normal breath sounds.  Skin:    General: Skin is warm and dry.  Neurological:     Mental Status: He is alert and oriented to person, place, and time.  Psychiatric:        Behavior: Behavior normal.     BP 125/74   Pulse 88   Ht 5\' 11"  (1.803 m)   Wt 288 lb (130.6 kg)   SpO2 96%   BMI 40.17 kg/m  Wt Readings from Last 3 Encounters:  04/25/21 288 lb (130.6 kg)  09/15/20 (!) 322 lb (146.1 kg)  03/22/20 (!) 314 lb (142.4 kg)     There are no preventive care reminders to display for this patient.  There are no preventive care reminders to display for this patient.  Lab Results  Component Value Date   TSH 0.84 10/11/2017   Lab Results  Component Value Date   WBC 6.9 04/21/2021   HGB 15.4 04/21/2021   HCT 46.0 04/21/2021   MCV 93.7 04/21/2021   PLT 224 04/21/2021   Lab Results  Component Value Date   NA 142 04/21/2021   K 4.3 04/21/2021   CO2 26 04/21/2021   GLUCOSE 88 04/21/2021   BUN 11 04/21/2021   CREATININE 0.77 04/21/2021   BILITOT 0.7 04/21/2021   ALKPHOS 72 07/09/2017   AST 22 04/21/2021   ALT 23 04/21/2021   PROT 7.4 04/21/2021   ALBUMIN 4.1 07/09/2017   CALCIUM 9.9 04/21/2021   Lab Results  Component Value Date   CHOL 250 (H) 04/21/2021   Lab Results  Component Value Date   HDL 39 (L) 04/21/2021   Lab Results  Component Value Date   LDLCALC 164 (H) 04/21/2021   Lab Results  Component Value Date   TRIG 285 (H) 04/21/2021   Lab Results  Component Value Date   CHOLHDL 6.4 (H) 04/21/2021   Lab Results  Component Value Date   HGBA1C 5.5 04/21/2021      Assessment & Plan:   Problem List Items Addressed This Visit      Cardiovascular  and Mediastinum   Atrial fibrillation (HCC)  He is still on his Xarelto and metoprolol.        Endocrine   IFG (impaired fasting glucose)    A1c overall looks phenomenal.  He is off the metformin so I did quite a remove that from his medication list.  He is still on Victoza.  Follow-up in 6 months is        Other   Obesity, Class III, BMI 40-49.9 (morbid obesity) (HCC)    BMI is now down to 40.  He has done a phenomenal job.  He reports that his weight was up to 330 pounds and since starting Nutrisystem in January he is now down to 288 pounds.  He says that he is planning on continuing with it and would like to continue to work to lose weight and to improve his overall health.  Noticed that his shortness of breath has actually gotten better since he is lost weight.      Mixed hyperlipidemia    Uric acid still elevated at 8.5 off of the allopurinol we discussed options we will restart allopurinol at 100 mg instead of 300 mg and then plan to recheck uric acid again in 6 months.  If he has a flare and exacerbation we can always call in colchicine if needed.      Gout   Relevant Medications   allopurinol (ZYLOPRIM) 100 MG tablet   Dyslipidemia - Primary    Not currently. Not on a statin currently.  10 yr risk is 11.5%. he really wants 6 more months to really work on diet and exercise to bring it down. Plan to recheck in 6 months.        Anxiety    He is asking for refill on diazepam.  He usually gets about 15-20 tabs per year.  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      Relevant Medications   diazepam (VALIUM) 5 MG tablet     The 10-year ASCVD risk score Denman George DC Jr., et al., 2013) is: 11.5%   Values used to calculate the score:     Age: 59 years     Sex: Male     Is Non-Hispanic African American: No     Diabetic: No     Tobacco smoker: No     Systolic Blood Pressure: 125 mmHg     Is BP treated: Yes     HDL Cholesterol: 39 mg/dL     Total Cholesterol: 250  mg/dL    Meds ordered this encounter  Medications  . allopurinol (ZYLOPRIM) 100 MG tablet    Sig: Take 1 tablet (100 mg total) by mouth daily.    Dispense:  90 tablet    Refill:  1  . diazepam (VALIUM) 5 MG tablet    Sig: Take 0.5-1 tablets (2.5-5 mg total) by mouth daily as needed for anxiety.    Dispense:  20 tablet    Refill:  0    Follow-up: Return in about 6 months (around 10/26/2021) for gout and a1c and recheck cholesterol .    Nani Gasser, MD

## 2021-04-25 NOTE — Assessment & Plan Note (Signed)
BMI is now down to 40.  He has done a phenomenal job.  He reports that his weight was up to 330 pounds and since starting Nutrisystem in January he is now down to 288 pounds.  He says that he is planning on continuing with it and would like to continue to work to lose weight and to improve his overall health.  Noticed that his shortness of breath has actually gotten better since he is lost weight.

## 2021-04-25 NOTE — Assessment & Plan Note (Signed)
Not currently. Not on a statin currently.  10 yr risk is 11.5%. he really wants 6 more months to really work on diet and exercise to bring it down. Plan to recheck in 6 months.

## 2021-04-25 NOTE — Assessment & Plan Note (Signed)
A1c overall looks phenomenal.  He is off the metformin so I did quite a remove that from his medication list.  He is still on Victoza.  Follow-up in 6 months is

## 2021-04-25 NOTE — Assessment & Plan Note (Signed)
Uric acid still elevated at 8.5 off of the allopurinol we discussed options we will restart allopurinol at 100 mg instead of 300 mg and then plan to recheck uric acid again in 6 months.  If he has a flare and exacerbation we can always call in colchicine if needed.

## 2021-04-25 NOTE — Progress Notes (Signed)
Pt reports that he stopped taking the Allopurinol, Atorvastatin and Metformin January 1,2022 when he started his diet.

## 2021-04-25 NOTE — Assessment & Plan Note (Signed)
He is still on his Xarelto and metoprolol.

## 2021-04-27 ENCOUNTER — Other Ambulatory Visit: Payer: Self-pay | Admitting: Family Medicine

## 2021-04-27 DIAGNOSIS — I4891 Unspecified atrial fibrillation: Secondary | ICD-10-CM

## 2021-05-08 ENCOUNTER — Other Ambulatory Visit: Payer: Self-pay | Admitting: Family Medicine

## 2021-05-18 ENCOUNTER — Other Ambulatory Visit: Payer: Self-pay | Admitting: Family Medicine

## 2021-08-28 ENCOUNTER — Other Ambulatory Visit: Payer: Self-pay | Admitting: Family Medicine

## 2021-08-28 DIAGNOSIS — I4891 Unspecified atrial fibrillation: Secondary | ICD-10-CM

## 2021-10-16 ENCOUNTER — Other Ambulatory Visit: Payer: Self-pay | Admitting: Family Medicine

## 2021-10-21 ENCOUNTER — Other Ambulatory Visit: Payer: Self-pay | Admitting: Family Medicine

## 2021-10-21 DIAGNOSIS — M10071 Idiopathic gout, right ankle and foot: Secondary | ICD-10-CM

## 2021-10-21 DIAGNOSIS — M1A0710 Idiopathic chronic gout, right ankle and foot, without tophus (tophi): Secondary | ICD-10-CM

## 2021-10-30 ENCOUNTER — Ambulatory Visit: Payer: BC Managed Care – PPO | Admitting: Family Medicine

## 2021-10-30 ENCOUNTER — Encounter: Payer: Self-pay | Admitting: Family Medicine

## 2021-10-30 VITALS — BP 116/72 | HR 77 | Temp 98.4°F | Ht 71.0 in | Wt 265.0 lb

## 2021-10-30 DIAGNOSIS — J019 Acute sinusitis, unspecified: Secondary | ICD-10-CM | POA: Diagnosis not present

## 2021-10-30 DIAGNOSIS — J029 Acute pharyngitis, unspecified: Secondary | ICD-10-CM | POA: Diagnosis not present

## 2021-10-30 LAB — POCT RAPID STREP A (OFFICE): Rapid Strep A Screen: NEGATIVE

## 2021-10-30 MED ORDER — AMOXICILLIN-POT CLAVULANATE 875-125 MG PO TABS: 1.0000 | ORAL_TABLET | Freq: Two times a day (BID) | ORAL | 0 refills | Status: DC

## 2021-10-30 NOTE — Progress Notes (Signed)
Acute Office Visit  Subjective:    Patient ID: Albert Humphrey, male    DOB: 1966-06-12, 55 y.o.   MRN: [id number]  Chief Complaint  Patient presents with   Sore Throat   Ear Pain    HPI Patient is in today for ST and ear pain that started about 2 days ago but he is also had significant headache and pressure for a little over a week.  He says he tends to have a lot of sinus problems and always is a little bit congested but does feel a little worse.  No fevers.  he has felt warm.  No stomach upset.  He has a lot of postnasal drip.  He has coughed up a little bit of phlegm this morning but does not have a persistent cough.  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  Past Medical History:  Diagnosis Date   Anxiety    Atrial fibrillation (HCC) 05/04/2015   Dislocation of hip    At birth   Dyslipidemia    Hypertension    Obesity     Past Surgical History:  Procedure Laterality Date   APPENDECTOMY     ectinartial colon resection     partial   HERNIA REPAIR     HIP SURGERY     TOTAL HIP ARTHROPLASTY Right 04/20/2013   5/5/20Dr. Doristine Counter    Family History  Problem Relation Age of Onset   Hypertension Father    Alcohol abuse Father    Heart disease Father        CVA   Stroke Father    Heart attack Other    Hypertension Mother     Social History   Socioeconomic History   Marital status: Single    Spouse name: Not on file   Number of children: Not on file   Years of education: Not on file   Highest education level: Not on file  Occupational History   Occupation: Teacher    Employer: [employer]  Tobacco Use   Smoking status: Former    Packs/day: 1.00    Years: 30.00    Pack years: 30.00    Types: Cigarettes    Start date: 07/24/2012   Smokeless tobacco: Never  Substance and Sexual Activity   Alcohol use: Yes    Alcohol/week: 3.0 standard drinks    Types: 3  Standard drinks or equivalent per week    Comment: per week   Drug use: No   Sexual activity: Not on file  Other Topics Concern   Not on file  Social History Narrative   No regular exercise.  Large amt of caffeine.    Social Determinants of Health   Financial Resource Strain: Not on file  Food Insecurity: Not on file  Transportation Needs: Not on file  Physical Activity: Not on file  Stress: Not on file  Social Connections: Not on file  Intimate Partner Violence: Not on file    Outpatient Medications Prior to Visit  Medication Sig Dispense Refill   allopurinol (ZYLOPRIM) 100 MG tablet TAKE 1 TABLET BY MOUTH EVERY DAY 90 tablet 1   B-D ULTRAFINE III SHORT PEN 31G X 8 MM MISC 1 EACH BY DOES NOT APPLY ROUTE DAILY. USE TO INJECT VICTOZA ONCE DAILY. 100 each 1   diazepam (VALIUM) 5 MG tablet Take 0.5-1 tablets (2.5-5 mg total) by mouth  daily as needed for anxiety. 20 tablet 0   metoprolol tartrate (LOPRESSOR) 50 MG tablet TAKE 1 TABLET BY MOUTH TWICE A DAY 180 tablet 1   sertraline (ZOLOFT) 100 MG tablet TAKE 2 TABLETS BY MOUTH EVERY DAY 180 tablet 0   VICTOZA 18 MG/3ML SOPN INJECT 1.8 MG UNDER THE SKIN ONCE DAILY 27 mL 3   XARELTO 20 MG TABS tablet TAKE 1 TABLET BY MOUTH EVERY DAY 90 tablet 3   No facility-administered medications prior to visit.    Allergies  Allergen Reactions   Warfarin Sodium     Other reaction(s): Unknown   Celexa [Citalopram Hydrobromide] Other (See Comments)    Weight gain   Pravastatin Other (See Comments)    Joint aches   Simvastatin Other (See Comments)    Joint aches    Review of Systems     Objective:    Physical Exam Constitutional:      Appearance: He is well-developed.  HENT:     Head: Normocephalic and atraumatic.     Right Ear: Tympanic membrane, ear canal and external ear normal.     Left Ear: Tympanic membrane, ear canal and external ear normal.     Nose: Nose normal.     Mouth/Throat:     Mouth: Mucous membranes are moist.      Pharynx: Posterior oropharyngeal erythema present.     Tonsils: No tonsillar exudate or tonsillar abscesses.  Eyes:     Conjunctiva/sclera: Conjunctivae normal.     Pupils: Pupils are equal, round, and reactive to light.  Neck:     Thyroid: No thyromegaly.  Cardiovascular:     Rate and Rhythm: Normal rate.     Heart sounds: Normal heart sounds.  Pulmonary:     Effort: Pulmonary effort is normal.     Breath sounds: Normal breath sounds.  Musculoskeletal:     Cervical back: Neck supple.  Lymphadenopathy:     Cervical: No cervical adenopathy.  Skin:    General: Skin is warm and dry.  Neurological:     Mental Status: He is alert and oriented to person, place, and time.    BP 116/72   Pulse 77   Temp 98.4 F (36.9 C)   Ht 5\' 11"  (1.803 m)   Wt 265 lb (120.2 kg)   SpO2 96%   BMI 36.96 kg/m  Wt Readings from Last 3 Encounters:  10/30/21 265 lb (120.2 kg)  04/25/21 288 lb (130.6 kg)  09/15/20 (!) 322 lb (146.1 kg)    Health Maintenance Due  Topic Date Due   Pneumococcal Vaccine 27-33 Years old (2 - PPSV23 if available, else PCV20) 01/04/2016   COVID-19 Vaccine (4 - Booster for Pfizer series) 01/02/2021    There are no preventive care reminders to display for this patient.   Lab Results  Component Value Date   TSH 0.84 10/11/2017   Lab Results  Component Value Date   WBC 6.9 04/21/2021   HGB 15.4 04/21/2021   HCT 46.0 04/21/2021   MCV 93.7 04/21/2021   PLT 224 04/21/2021   Lab Results  Component Value Date   NA 142 04/21/2021   K 4.3 04/21/2021   CO2 26 04/21/2021   GLUCOSE 88 04/21/2021   BUN 11 04/21/2021   CREATININE 0.77 04/21/2021   BILITOT 0.7 04/21/2021   ALKPHOS 72 07/09/2017   AST 22 04/21/2021   ALT 23 04/21/2021   PROT 7.4 04/21/2021   ALBUMIN 4.1 07/09/2017   CALCIUM 9.9 04/21/2021  Lab Results  Component Value Date   CHOL 250 (H) 04/21/2021   Lab Results  Component Value Date   HDL 39 (L) 04/21/2021   Lab Results  Component  Value Date   LDLCALC 164 (H) 04/21/2021   Lab Results  Component Value Date   TRIG 285 (H) 04/21/2021   Lab Results  Component Value Date   CHOLHDL 6.4 (H) 04/21/2021   Lab Results  Component Value Date   HGBA1C 5.5 04/21/2021       Assessment & Plan:   Problem List Items Addressed This Visit   None Visit Diagnoses     Sore throat    -  Primary   Relevant Orders   POCT rapid strep A (Completed)   Acute non-recurrent sinusitis, unspecified location       Relevant Medications   amoxicillin-clavulanate (AUGMENTIN) 875-125 MG tablet      Acute sinusitis-we will treat with Augmentin.  Okay to use humidifier, nasal saline and symptomatic treatment.  Call if not better in 1 week.  Meds ordered this encounter  Medications   amoxicillin-clavulanate (AUGMENTIN) 875-125 MG tablet    Sig: Take 1 tablet by mouth 2 (two) times daily.    Dispense:  14 tablet    Refill:  0     Nani Gasser, MD

## 2021-11-07 ENCOUNTER — Other Ambulatory Visit: Payer: Self-pay | Admitting: Family Medicine

## 2021-11-08 ENCOUNTER — Encounter: Payer: Self-pay | Admitting: Family Medicine

## 2021-11-08 MED ORDER — PREDNISONE 20 MG PO TABS: 40.0000 mg | ORAL_TABLET | Freq: Every day | ORAL | 0 refills | Status: DC

## 2021-11-08 NOTE — Telephone Encounter (Signed)
Meds ordered this encounter  Medications   predniSONE (DELTASONE) 20 MG tablet    Sig: Take 2 tablets (40 mg total) by mouth daily with breakfast.    Dispense:  10 tablet    Refill:  0    

## 2021-12-21 ENCOUNTER — Other Ambulatory Visit: Payer: Self-pay | Admitting: Family Medicine

## 2021-12-21 DIAGNOSIS — I4891 Unspecified atrial fibrillation: Secondary | ICD-10-CM

## 2022-01-01 ENCOUNTER — Other Ambulatory Visit: Payer: Self-pay | Admitting: Family Medicine

## 2022-01-08 ENCOUNTER — Telehealth: Payer: Self-pay | Admitting: Family Medicine

## 2022-01-08 DIAGNOSIS — Z13220 Encounter for screening for lipoid disorders: Secondary | ICD-10-CM

## 2022-01-08 DIAGNOSIS — Z0000 Encounter for general adult medical examination without abnormal findings: Secondary | ICD-10-CM

## 2022-01-08 DIAGNOSIS — Z131 Encounter for screening for diabetes mellitus: Secondary | ICD-10-CM

## 2022-01-08 NOTE — Telephone Encounter (Signed)
Patient is scheduled for an appointment on 2/10 at 2pm but would like to come in for lab work before so he can be fasting if needed. Please advise

## 2022-01-09 NOTE — Telephone Encounter (Signed)
Labs Ordered

## 2022-01-09 NOTE — Addendum Note (Signed)
Addended by: Ernest Mallick A on: 01/09/2022 11:07 AM   Modules accepted: Orders

## 2022-01-16 ENCOUNTER — Ambulatory Visit: Payer: BC Managed Care – PPO | Admitting: Family Medicine

## 2022-01-16 ENCOUNTER — Other Ambulatory Visit: Payer: Self-pay

## 2022-01-16 ENCOUNTER — Encounter: Payer: Self-pay | Admitting: Family Medicine

## 2022-01-16 VITALS — BP 116/68 | HR 69 | Resp 18 | Ht 71.0 in | Wt 271.0 lb

## 2022-01-16 DIAGNOSIS — R051 Acute cough: Secondary | ICD-10-CM | POA: Diagnosis not present

## 2022-01-16 DIAGNOSIS — J040 Acute laryngitis: Secondary | ICD-10-CM | POA: Diagnosis not present

## 2022-01-16 DIAGNOSIS — J069 Acute upper respiratory infection, unspecified: Secondary | ICD-10-CM

## 2022-01-16 LAB — POCT INFLUENZA A/B
Influenza A, POC: NEGATIVE
Influenza B, POC: NEGATIVE

## 2022-01-16 MED ORDER — HYDROCODONE BIT-HOMATROP MBR 5-1.5 MG/5ML PO SOLN: 5.0000 mL | Freq: Every evening | ORAL | 0 refills | Status: DC | PRN

## 2022-01-16 NOTE — Patient Instructions (Signed)
Please contact our office if your symptoms fail to improve.

## 2022-01-16 NOTE — Progress Notes (Signed)
Acute Office Visit  Subjective:    Patient ID: Albert Humphrey, male    DOB: July 16, 1966, 56 y.o.   MRN: [id number]  Chief Complaint  Patient presents with   Cough    Loss of voice, bodyaches 2-3 day, covid exposure at work.     HPI Patient is in today for cough, sore throat and nasal congestion that started about 3 or 4 days ago.  He also has laryngitis but says that is a little bit better today he did take some NyQuil last night.  No fever at least as far as he can tell.  He did do a rapid swab at work for [employer] yesterday because he did find out on Friday he was sitting next to someone in an 8-hour meeting who actually tested positive for COVID over the weekend. + bodyaches.   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 Tacey Ruiz    Family History  Problem Relation Age of Onset   Hypertension Father    Alcohol abuse Father    Heart disease Father        CVA   Stroke Father    Heart attack Other    Hypertension Mother     Social History   Socioeconomic History   Marital status: Single    Spouse name: Not on file   Number of children: Not on file   Years of education: Not on file   Highest education level: Not on file  Occupational History   Occupation: Teacher    Employer: [employer]  Tobacco Use   Smoking status: Former    Packs/day: 1.00    Years: 30.00    Pack years: 30.00    Types: Cigarettes    Start date: 07/24/2012   Smokeless tobacco: Never  Substance and Sexual Activity   Alcohol use: Yes    Alcohol/week: 3.0 standard drinks    Types: 3 Standard drinks or equivalent per week    Comment: per week   Drug use: No    Sexual activity: Not on file  Other Topics Concern   Not on file  Social History Narrative   No regular exercise.  Large amt of caffeine.    Social Determinants of Health   Financial Resource Strain: Not on file  Food Insecurity: Not on file  Transportation Needs: Not on file  Physical Activity: Not on file  Stress: Not on file  Social Connections: Not on file  Intimate Partner Violence: Not on file    Outpatient Medications Prior to Visit  Medication Sig Dispense Refill   allopurinol (ZYLOPRIM) 100 MG tablet TAKE 1 TABLET BY MOUTH EVERY DAY 90 tablet 1   B-D ULTRAFINE III SHORT PEN 31G X 8 MM MISC 1 EACH BY DOES NOT APPLY ROUTE DAILY. USE TO INJECT VICTOZA ONCE DAILY. 100 each 1   diazepam (VALIUM) 5 MG tablet Take 0.5-1 tablets (2.5-5 mg total) by mouth daily as needed for anxiety. 20 tablet 0   metoprolol tartrate (LOPRESSOR) 50 MG tablet TAKE 1 TABLET BY MOUTH  TWICE A DAY 180 tablet 0   sertraline (ZOLOFT) 100 MG tablet Take 2 tablets (200 mg total) by mouth daily. NEEDS APPT FOR REFILLS. CALL OFFICE TO SCHEDULE. 60 tablet 0   VICTOZA 18 MG/3ML SOPN INJECT 1.8 MG UNDER THE SKIN ONCE DAILY 27 mL 3   XARELTO 20 MG TABS tablet TAKE 1 TABLET BY MOUTH EVERY DAY 90 tablet 3   amoxicillin-clavulanate (AUGMENTIN) 875-125 MG tablet Take 1 tablet by mouth 2 (two) times daily. 14 tablet 0   predniSONE (DELTASONE) 20 MG tablet Take 2 tablets (40 mg total) by mouth daily with breakfast. 10 tablet 0   No facility-administered medications prior to visit.    Allergies  Allergen Reactions   Warfarin Sodium     Other reaction(s): Unknown   Celexa [Citalopram Hydrobromide] Other (See Comments)    Weight gain   Pravastatin Other (See Comments)    Joint aches   Simvastatin Other (See Comments)    Joint aches    Review of Systems     Objective:    Physical Exam Constitutional:      Appearance: He is well-developed.  HENT:     Head: Normocephalic and atraumatic.     Right Ear:  Tympanic membrane, ear canal and external ear normal.     Left Ear: Tympanic membrane, ear canal and external ear normal.     Nose: Nose normal.     Mouth/Throat:     Mouth: Mucous membranes are moist.     Pharynx: Oropharynx is clear.     Comments: Uvula is mildly erythematous Eyes:     Conjunctiva/sclera: Conjunctivae normal.     Pupils: Pupils are equal, round, and reactive to light.  Neck:     Thyroid: No thyromegaly.  Cardiovascular:     Rate and Rhythm: Normal rate.     Heart sounds: Normal heart sounds.  Pulmonary:     Effort: Pulmonary effort is normal.     Breath sounds: Normal breath sounds.  Musculoskeletal:     Cervical back: Neck supple.  Lymphadenopathy:     Cervical: No cervical adenopathy.  Skin:    General: Skin is warm and dry.  Neurological:     Mental Status: He is alert and oriented to person, place, and time.    BP 116/68    Pulse 69    Resp 18    Ht 5\' 11"  (1.803 m)    Wt 271 lb (122.9 kg)    BMI 37.80 kg/m  Wt Readings from Last 3 Encounters:  01/16/22 271 lb (122.9 kg)  10/30/21 265 lb (120.2 kg)  04/25/21 288 lb (130.6 kg)    Health Maintenance Due  Topic Date Due   HIV Screening  Never done    There are no preventive care reminders to display for this patient.   Lab Results  Component Value Date   TSH 0.84 10/11/2017   Lab Results  Component Value Date   WBC 6.9 04/21/2021   HGB 15.4 04/21/2021   HCT 46.0 04/21/2021   MCV 93.7 04/21/2021   PLT 224 04/21/2021   Lab Results  Component Value Date   NA 142 04/21/2021   K 4.3 04/21/2021   CO2 26 04/21/2021   GLUCOSE 88 04/21/2021   BUN 11 04/21/2021   CREATININE 0.77 04/21/2021   BILITOT 0.7 04/21/2021   ALKPHOS 72 07/09/2017   AST 22 04/21/2021   ALT 23 04/21/2021   PROT 7.4 04/21/2021   ALBUMIN 4.1 07/09/2017   CALCIUM  9.9 04/21/2021   Lab Results  Component Value Date   CHOL 250 (H) 04/21/2021   Lab Results  Component Value Date   HDL 39 (L) 04/21/2021   Lab  Results  Component Value Date   LDLCALC 164 (H) 04/21/2021   Lab Results  Component Value Date   TRIG 285 (H) 04/21/2021   Lab Results  Component Value Date   CHOLHDL 6.4 (H) 04/21/2021   Lab Results  Component Value Date   HGBA1C 5.5 04/21/2021       Assessment & Plan:   Problem List Items Addressed This Visit   None Visit Diagnoses     Acute cough    -  Primary   Relevant Orders   POCT Influenza A/B (Completed)   Novel Coronavirus, NAA ([facility])   Viral upper respiratory tract infection       Laryngitis           For respiratory infection-negative for flu today.  PCR COVID test pending since he did have a significant exposure on Friday.  We will call with those results once available in the short interim recommend symptomatic care.  We will write him out of work for the rest of the week and also sent over prescription for some cough syrup to use as needed.  Okay to also use over-the-counter cough and cold medicines   Meds ordered this encounter  Medications   HYDROcodone bit-homatropine (HYCODAN) 5-1.5 MG/5ML syrup    Sig: Take 5-10 mLs by mouth at bedtime as needed for cough.    Dispense:  60 mL    Refill:  0     Beatrice Lecher, MD

## 2022-01-17 ENCOUNTER — Encounter: Payer: Self-pay | Admitting: Family Medicine

## 2022-01-17 LAB — NOVEL CORONAVIRUS, NAA: SARS-CoV-2, NAA: NOT DETECTED

## 2022-01-17 LAB — SARS-COV-2, NAA 2 DAY TAT

## 2022-01-17 NOTE — Progress Notes (Signed)
Elenore Rota, great news negative for COVID.  Let us know how you are doing hopefully better.

## 2022-01-26 ENCOUNTER — Other Ambulatory Visit: Payer: Self-pay

## 2022-01-26 ENCOUNTER — Ambulatory Visit (INDEPENDENT_AMBULATORY_CARE_PROVIDER_SITE_OTHER): Payer: BC Managed Care – PPO | Admitting: Family Medicine

## 2022-01-26 ENCOUNTER — Encounter: Payer: Self-pay | Admitting: Family Medicine

## 2022-01-26 VITALS — BP 106/73 | HR 88 | Resp 18 | Ht 71.0 in | Wt 278.0 lb

## 2022-01-26 DIAGNOSIS — R350 Frequency of micturition: Secondary | ICD-10-CM

## 2022-01-26 DIAGNOSIS — M10071 Idiopathic gout, right ankle and foot: Secondary | ICD-10-CM

## 2022-01-26 DIAGNOSIS — Z131 Encounter for screening for diabetes mellitus: Secondary | ICD-10-CM

## 2022-01-26 DIAGNOSIS — Z0000 Encounter for general adult medical examination without abnormal findings: Secondary | ICD-10-CM

## 2022-01-26 DIAGNOSIS — N401 Enlarged prostate with lower urinary tract symptoms: Secondary | ICD-10-CM

## 2022-01-26 DIAGNOSIS — F419 Anxiety disorder, unspecified: Secondary | ICD-10-CM

## 2022-01-26 DIAGNOSIS — M1A0710 Idiopathic chronic gout, right ankle and foot, without tophus (tophi): Secondary | ICD-10-CM

## 2022-01-26 DIAGNOSIS — Z23 Encounter for immunization: Secondary | ICD-10-CM | POA: Diagnosis not present

## 2022-01-26 DIAGNOSIS — R3915 Urgency of urination: Secondary | ICD-10-CM

## 2022-01-26 DIAGNOSIS — I4891 Unspecified atrial fibrillation: Secondary | ICD-10-CM

## 2022-01-26 LAB — POCT URINALYSIS DIP (CLINITEK)
Bilirubin, UA: NEGATIVE
Blood, UA: NEGATIVE
Glucose, UA: NEGATIVE mg/dL
Ketones, POC UA: NEGATIVE mg/dL
Leukocytes, UA: NEGATIVE
Nitrite, UA: NEGATIVE
POC PROTEIN,UA: NEGATIVE
Spec Grav, UA: 1.025 (ref 1.010–1.025)
Urobilinogen, UA: 1 E.U./dL
pH, UA: 6 (ref 5.0–8.0)

## 2022-01-26 MED ORDER — TAMSULOSIN HCL 0.4 MG PO CAPS: 0.4000 mg | ORAL_CAPSULE | Freq: Every day | ORAL | 1 refills | Status: DC

## 2022-01-26 MED ORDER — SERTRALINE HCL 100 MG PO TABS: 200.0000 mg | ORAL_TABLET | Freq: Every day | ORAL | 1 refills | Status: DC

## 2022-01-26 MED ORDER — VICTOZA 18 MG/3ML ~~LOC~~ SOPN: 2.4000 mg | PEN_INJECTOR | Freq: Every day | SUBCUTANEOUS | 3 refills | Status: DC

## 2022-01-26 MED ORDER — ALLOPURINOL 100 MG PO TABS: 100.0000 mg | ORAL_TABLET | Freq: Every day | ORAL | 1 refills | Status: DC

## 2022-01-26 MED ORDER — FINASTERIDE 5 MG PO TABS: 5.0000 mg | ORAL_TABLET | Freq: Every day | ORAL | 1 refills | Status: DC

## 2022-01-26 MED ORDER — METOPROLOL TARTRATE 50 MG PO TABS: 50.0000 mg | ORAL_TABLET | Freq: Two times a day (BID) | ORAL | 0 refills | Status: DC

## 2022-01-26 NOTE — Assessment & Plan Note (Signed)
Dsicussed that I do think his symptoms are most consistent with his BPH.  He declined a prostate exam today.  We will make sure that he did have a PSA drawn on her let his labs this morning if not we can always add it on.  I do think he would benefit from going back on medication.  He was on Flomax at 1 point.

## 2022-01-26 NOTE — Progress Notes (Signed)
Established Patient Office Visit  Subjective:  Patient ID: Albert Humphrey, male    DOB: 1966/05/17  Age: 56 y.o. MRN: [id number]  CC:  Chief Complaint  Patient presents with   Annual Exam   Urinary Frequency    Several weeks.     HPI Albert Humphrey presents for CPE.  He is actually doing well overall.  Feeling much better from the respiratory infection that he had last week.  Plans on starting to work on exercise again and really wants to tackle getting his weight back down.  Has been doing well with the Victoza but will need refills.  Also reports urinary frequency that is been going on for at least 6 to 7 months feeling like he has to urinate every 20 minutes during the day.  Also experiencing nocturia 2-3 times per night.  No burning with urination.  Past Medical History:  Diagnosis Date   Anxiety    Atrial fibrillation ([place]) 05/04/2015   Dislocation of hip    At birth   Dyslipidemia    Hypertension    Obesity     Past Surgical History:  Procedure Laterality Date   APPENDECTOMY     ectinartial colon resection     partial   HERNIA REPAIR     HIP SURGERY     TOTAL HIP ARTHROPLASTY Right 04/20/2013   5/5/20Dr. Tacey Ruiz    Family History  Problem Relation Age of Onset   Hypertension Father    Alcohol abuse Father    Heart disease Father        CVA   Stroke Father    Heart attack Other    Hypertension Mother     Social History   Socioeconomic History   Marital status: Single    Spouse name: Not on file   Number of children: 0   Years of education: Not on file   Highest education level: Not on file  Occupational History   Occupation: Product manager: [employer]  Tobacco Use   Smoking status: Former    Packs/day: 1.00    Years: 30.00    Pack years: 30.00    Types: Cigarettes    Start date: 07/24/2012   Smokeless tobacco: Never  Substance and Sexual Activity   Alcohol use: Not Currently   Drug use: No   Sexual activity: Not on file   Other Topics Concern   Not on file  Social History Narrative   No regular exercise.  Large amt of caffeine/5 diet cokes a day. Coffee 1-2 cups a day    Social Determinants of Health   Financial Resource Strain: Not on file  Food Insecurity: Not on file  Transportation Needs: Not on file  Physical Activity: Not on file  Stress: Not on file  Social Connections: Not on file  Intimate Partner Violence: Not on file    Outpatient Medications Prior to Visit  Medication Sig Dispense Refill   B-D ULTRAFINE III SHORT PEN 31G X 8 MM MISC 1 EACH BY DOES NOT APPLY ROUTE DAILY. USE TO INJECT VICTOZA ONCE DAILY. 100 each 1   diazepam (VALIUM) 5 MG tablet Take 0.5-1 tablets (2.5-5 mg total) by mouth daily as needed for anxiety. 20 tablet 0   HYDROcodone bit-homatropine (HYCODAN) 5-1.5 MG/5ML syrup Take 5-10 mLs by mouth at bedtime as needed for cough. 60 mL 0   XARELTO 20 MG TABS tablet TAKE 1 TABLET BY MOUTH EVERY DAY 90 tablet 3   allopurinol (  ZYLOPRIM) 100 MG tablet TAKE 1 TABLET BY MOUTH EVERY DAY 90 tablet 1   metoprolol tartrate (LOPRESSOR) 50 MG tablet TAKE 1 TABLET BY MOUTH TWICE A DAY 180 tablet 0   sertraline (ZOLOFT) 100 MG tablet Take 2 tablets (200 mg total) by mouth daily. NEEDS APPT FOR REFILLS. CALL OFFICE TO SCHEDULE. 60 tablet 0   VICTOZA 18 MG/3ML SOPN INJECT 1.8 MG UNDER THE SKIN ONCE DAILY 27 mL 3   No facility-administered medications prior to visit.    Allergies  Allergen Reactions   Warfarin Sodium     Other reaction(s): Unknown   Celexa [Citalopram Hydrobromide] Other (See Comments)    Weight gain   Pravastatin Other (See Comments)    Joint aches   Simvastatin Other (See Comments)    Joint aches    ROS Review of Systems    Objective:    Physical Exam Constitutional:      Appearance: He is well-developed.  HENT:     Head: Normocephalic and atraumatic.     Right Ear: Tympanic membrane, ear canal and external ear normal.     Left Ear: Tympanic membrane,  ear canal and external ear normal.     Nose: Nose normal.     Mouth/Throat:     Mouth: Mucous membranes are moist.     Pharynx: Oropharynx is clear. No posterior oropharyngeal erythema.  Eyes:     Conjunctiva/sclera: Conjunctivae normal.     Pupils: Pupils are equal, round, and reactive to light.  Neck:     Thyroid: No thyromegaly.  Cardiovascular:     Rate and Rhythm: Normal rate and regular rhythm.     Heart sounds: Normal heart sounds.  Pulmonary:     Effort: Pulmonary effort is normal.     Breath sounds: Normal breath sounds.  Abdominal:     General: Bowel sounds are normal. There is no distension.     Palpations: Abdomen is soft. There is no mass.     Tenderness: There is no abdominal tenderness. There is no guarding or rebound.  Musculoskeletal:        General: Normal range of motion.     Cervical back: Normal range of motion and neck supple.  Lymphadenopathy:     Cervical: No cervical adenopathy.  Skin:    General: Skin is warm and dry.  Neurological:     Mental Status: He is alert and oriented to person, place, and time.     Deep Tendon Reflexes: Reflexes are normal and symmetric.  Psychiatric:        Mood and Affect: Mood normal.        Behavior: Behavior normal.        Thought Content: Thought content normal.        Judgment: Judgment normal.    BP 106/73    Pulse 88    Resp 18    Ht 5' 11"  (1.803 m)    Wt 278 lb (126.1 kg)    SpO2 97%    BMI 38.77 kg/m  Wt Readings from Last 3 Encounters:  01/26/22 278 lb (126.1 kg)  01/16/22 271 lb (122.9 kg)  10/30/21 265 lb (120.2 kg)     Health Maintenance Due  Topic Date Due   HIV Screening  Never done   Pneumococcal Vaccine 29-27 Years old (2 - PPSV23 if available, else PCV20) 01/04/2016    There are no preventive care reminders to display for this patient.  Lab Results  Component Value Date  TSH 0.84 10/11/2017   Lab Results  Component Value Date   WBC 7.6 01/26/2022   HGB 14.3 01/26/2022   HCT 43.1  01/26/2022   MCV 94.9 01/26/2022   PLT 255 01/26/2022   Lab Results  Component Value Date   NA 139 01/26/2022   K 4.4 01/26/2022   CO2 27 01/26/2022   GLUCOSE 96 01/26/2022   BUN 13 01/26/2022   CREATININE 0.78 01/26/2022   BILITOT 0.4 01/26/2022   ALKPHOS 72 07/09/2017   AST 23 01/26/2022   ALT 22 01/26/2022   PROT 7.1 01/26/2022   ALBUMIN 4.1 07/09/2017   CALCIUM 9.4 01/26/2022   EGFR 103 01/26/2022   Lab Results  Component Value Date   CHOL 235 (H) 01/26/2022   Lab Results  Component Value Date   HDL 41 01/26/2022   Lab Results  Component Value Date   LDLCALC 157 (H) 01/26/2022   Lab Results  Component Value Date   TRIG 209 (H) 01/26/2022   Lab Results  Component Value Date   CHOLHDL 5.7 (H) 01/26/2022   Lab Results  Component Value Date   HGBA1C 5.5 01/26/2022      Assessment & Plan:   Problem List Items Addressed This Visit       Cardiovascular and Mediastinum   Atrial fibrillation (HCC)   Relevant Medications   metoprolol tartrate (LOPRESSOR) 50 MG tablet     Other   Obesity, Class III, BMI 40-49.9 (morbid obesity) (HCC)   Relevant Medications   liraglutide (VICTOZA) 18 MG/3ML SOPN   Gout   Relevant Medications   allopurinol (ZYLOPRIM) 100 MG tablet   Benign prostatic hyperplasia (BPH) with urinary urgency and nocturia    Dsicussed that I do think his symptoms are most consistent with his BPH.  He declined a prostate exam today.  We will make sure that he did have a PSA drawn on her let his labs this morning if not we can always add it on.  I do think he would benefit from going back on medication.  He was on Flomax at 1 point.      Relevant Medications   tamsulosin (FLOMAX) 0.4 MG CAPS capsule   finasteride (PROSCAR) 5 MG tablet   Anxiety   Relevant Medications   sertraline (ZOLOFT) 100 MG tablet   Other Visit Diagnoses     Wellness examination    -  Primary   Relevant Orders   Pneumococcal conjugate vaccine 20-valent (Prevnar 20)    Urinary frequency       Relevant Orders   POCT URINALYSIS DIP (CLINITEK) (Completed)   Immunization due       Relevant Orders   Pneumococcal conjugate vaccine 20-valent (Prevnar 20)   Screening for diabetes mellitus       Relevant Medications   liraglutide (VICTOZA) 18 MG/3ML SOPN      Keep up a regular exercise program and make sure you are eating a healthy diet Try to eat 4 servings of dairy a day, or if you are lactose intolerant take a calcium with vitamin D daily.  Prevnar 20 given today.   Urinary frequency -suspect related to BPH.   Meds ordered this encounter  Medications   liraglutide (VICTOZA) 18 MG/3ML SOPN    Sig: Inject 2.4 mg into the skin daily.    Dispense:  9 mL    Refill:  3   sertraline (ZOLOFT) 100 MG tablet    Sig: Take 2 tablets (200 mg total) by mouth daily.  Dispense:  180 tablet    Refill:  1   metoprolol tartrate (LOPRESSOR) 50 MG tablet    Sig: Take 1 tablet (50 mg total) by mouth 2 (two) times daily.    Dispense:  180 tablet    Refill:  0    Patient is due for office visit for further refills.   allopurinol (ZYLOPRIM) 100 MG tablet    Sig: Take 1 tablet (100 mg total) by mouth daily.    Dispense:  90 tablet    Refill:  1   tamsulosin (FLOMAX) 0.4 MG CAPS capsule    Sig: Take 1 capsule (0.4 mg total) by mouth daily. 30 min after evening meal    Dispense:  90 capsule    Refill:  1   finasteride (PROSCAR) 5 MG tablet    Sig: Take 1 tablet (5 mg total) by mouth daily.    Dispense:  90 tablet    Refill:  1    Follow-up: No follow-ups on file.    Beatrice Lecher, MD

## 2022-01-27 LAB — HEMOGLOBIN A1C
Hgb A1c MFr Bld: 5.5 % of total Hgb (ref <5.7)
Mean Plasma Glucose: 111 mg/dL
eAG (mmol/L): 6.2 mmol/L

## 2022-01-27 LAB — COMPLETE METABOLIC PANEL WITH GFR
AG Ratio: 1.4 (calc) (ref 1.0–2.5)
ALT: 22 U/L (ref 9–46)
AST: 23 U/L (ref 10–35)
Albumin: 4.2 g/dL (ref 3.6–5.1)
Alkaline phosphatase (APISO): 65 U/L (ref 35–144)
BUN: 13 mg/dL (ref 7–25)
CO2: 27 mmol/L (ref 20–32)
Calcium: 9.4 mg/dL (ref 8.6–10.3)
Chloride: 106 mmol/L (ref 98–110)
Creat: 0.78 mg/dL (ref 0.70–1.30)
Globulin: 2.9 g/dL (calc) (ref 1.9–3.7)
Glucose, Bld: 96 mg/dL (ref 65–139)
Potassium: 4.4 mmol/L (ref 3.5–5.3)
Sodium: 139 mmol/L (ref 135–146)
Total Bilirubin: 0.4 mg/dL (ref 0.2–1.2)
Total Protein: 7.1 g/dL (ref 6.1–8.1)
eGFR: 103 mL/min/{1.73_m2} (ref >=60)

## 2022-01-27 LAB — CBC WITH DIFFERENTIAL/PLATELET
Absolute Monocytes: 578 cells/uL (ref 200–950)
Basophils Absolute: 84 cells/uL (ref 0–200)
Basophils Relative: 1.1 %
Eosinophils Absolute: 167 cells/uL (ref 15–500)
Eosinophils Relative: 2.2 %
HCT: 43.1 % (ref 38.5–50.0)
Hemoglobin: 14.3 g/dL (ref 13.2–17.1)
Lymphs Abs: 2652 cells/uL (ref 850–3900)
MCH: 31.5 pg (ref 27.0–33.0)
MCHC: 33.2 g/dL (ref 32.0–36.0)
MCV: 94.9 fL (ref 80.0–100.0)
MPV: 9.7 fL (ref 7.5–12.5)
Monocytes Relative: 7.6 %
Neutro Abs: 4119 cells/uL (ref 1500–7800)
Neutrophils Relative %: 54.2 %
Platelets: 255 10*3/uL (ref 140–400)
RBC: 4.54 10*6/uL (ref 4.20–5.80)
RDW: 12.9 % (ref 11.0–15.0)
Total Lymphocyte: 34.9 %
WBC: 7.6 10*3/uL (ref 3.8–10.8)

## 2022-01-27 LAB — LIPID PANEL
Cholesterol: 235 mg/dL — ABNORMAL HIGH (ref <200)
HDL: 41 mg/dL (ref >=40)
LDL Cholesterol (Calc): 157 mg/dL (calc) — ABNORMAL HIGH
Non-HDL Cholesterol (Calc): 194 mg/dL (calc) — ABNORMAL HIGH (ref <130)
Total CHOL/HDL Ratio: 5.7 (calc) — ABNORMAL HIGH (ref <5.0)
Triglycerides: 209 mg/dL — ABNORMAL HIGH (ref <150)

## 2022-01-28 NOTE — Progress Notes (Signed)
Hi Buckley, Your cholesterol looks better this time.  Still needs some work but it is better.  Your 10 year calculate risk for heart disease is > 10 % so it is strongly recommended to take a statin to lower your risk.  Let me know if you are open to that. Your blood count and metabolic panel was normal.  Your A1C is normal. No sign of diabetes.    The 10-year ASCVD risk score (Arnett DK, et al., 2019) is: 14.1%   Values used to calculate the score:     Age: 56 years     Sex: Male     Is Non-Hispanic African American: No     Diabetic: No     Tobacco smoker: Yes     Systolic Blood Pressure: 106 mmHg     Is BP treated: Yes     HDL Cholesterol: 41 mg/dL     Total Cholesterol: 235 mg/dL

## 2022-01-29 ENCOUNTER — Other Ambulatory Visit: Payer: Self-pay | Admitting: Family Medicine

## 2022-01-29 DIAGNOSIS — Z0000 Encounter for general adult medical examination without abnormal findings: Secondary | ICD-10-CM | POA: Diagnosis not present

## 2022-01-29 DIAGNOSIS — Z23 Encounter for immunization: Secondary | ICD-10-CM | POA: Diagnosis not present

## 2022-01-29 DIAGNOSIS — Z131 Encounter for screening for diabetes mellitus: Secondary | ICD-10-CM

## 2022-01-29 MED ORDER — ROSUVASTATIN CALCIUM 5 MG PO TABS: 5.0000 mg | ORAL_TABLET | ORAL | 1 refills | Status: DC

## 2022-01-29 NOTE — Addendum Note (Signed)
Addended by: Beatrice Lecher D on: 01/29/2022 04:24 PM   Modules accepted: Orders

## 2022-01-29 NOTE — Progress Notes (Signed)
Orders Placed This Encounter     rosuvastatin (CRESTOR) 5 MG tablet         Sig: Take 1 tablet (5 mg total) by mouth every other day. At bedtime         Dispense:  45 tablet         Refill:  1

## 2022-02-16 ENCOUNTER — Other Ambulatory Visit: Payer: Self-pay | Admitting: Family Medicine

## 2022-02-16 DIAGNOSIS — Z131 Encounter for screening for diabetes mellitus: Secondary | ICD-10-CM

## 2022-03-07 IMAGING — DX DG CHEST 2V
2 series · 2 of 2 positions shown · non-contrast
Comparison: 02/18/2017

CLINICAL DATA: Productive cough, shortness of breath, upper back

EXAM:
CHEST - 2 VIEW

[chest pa]
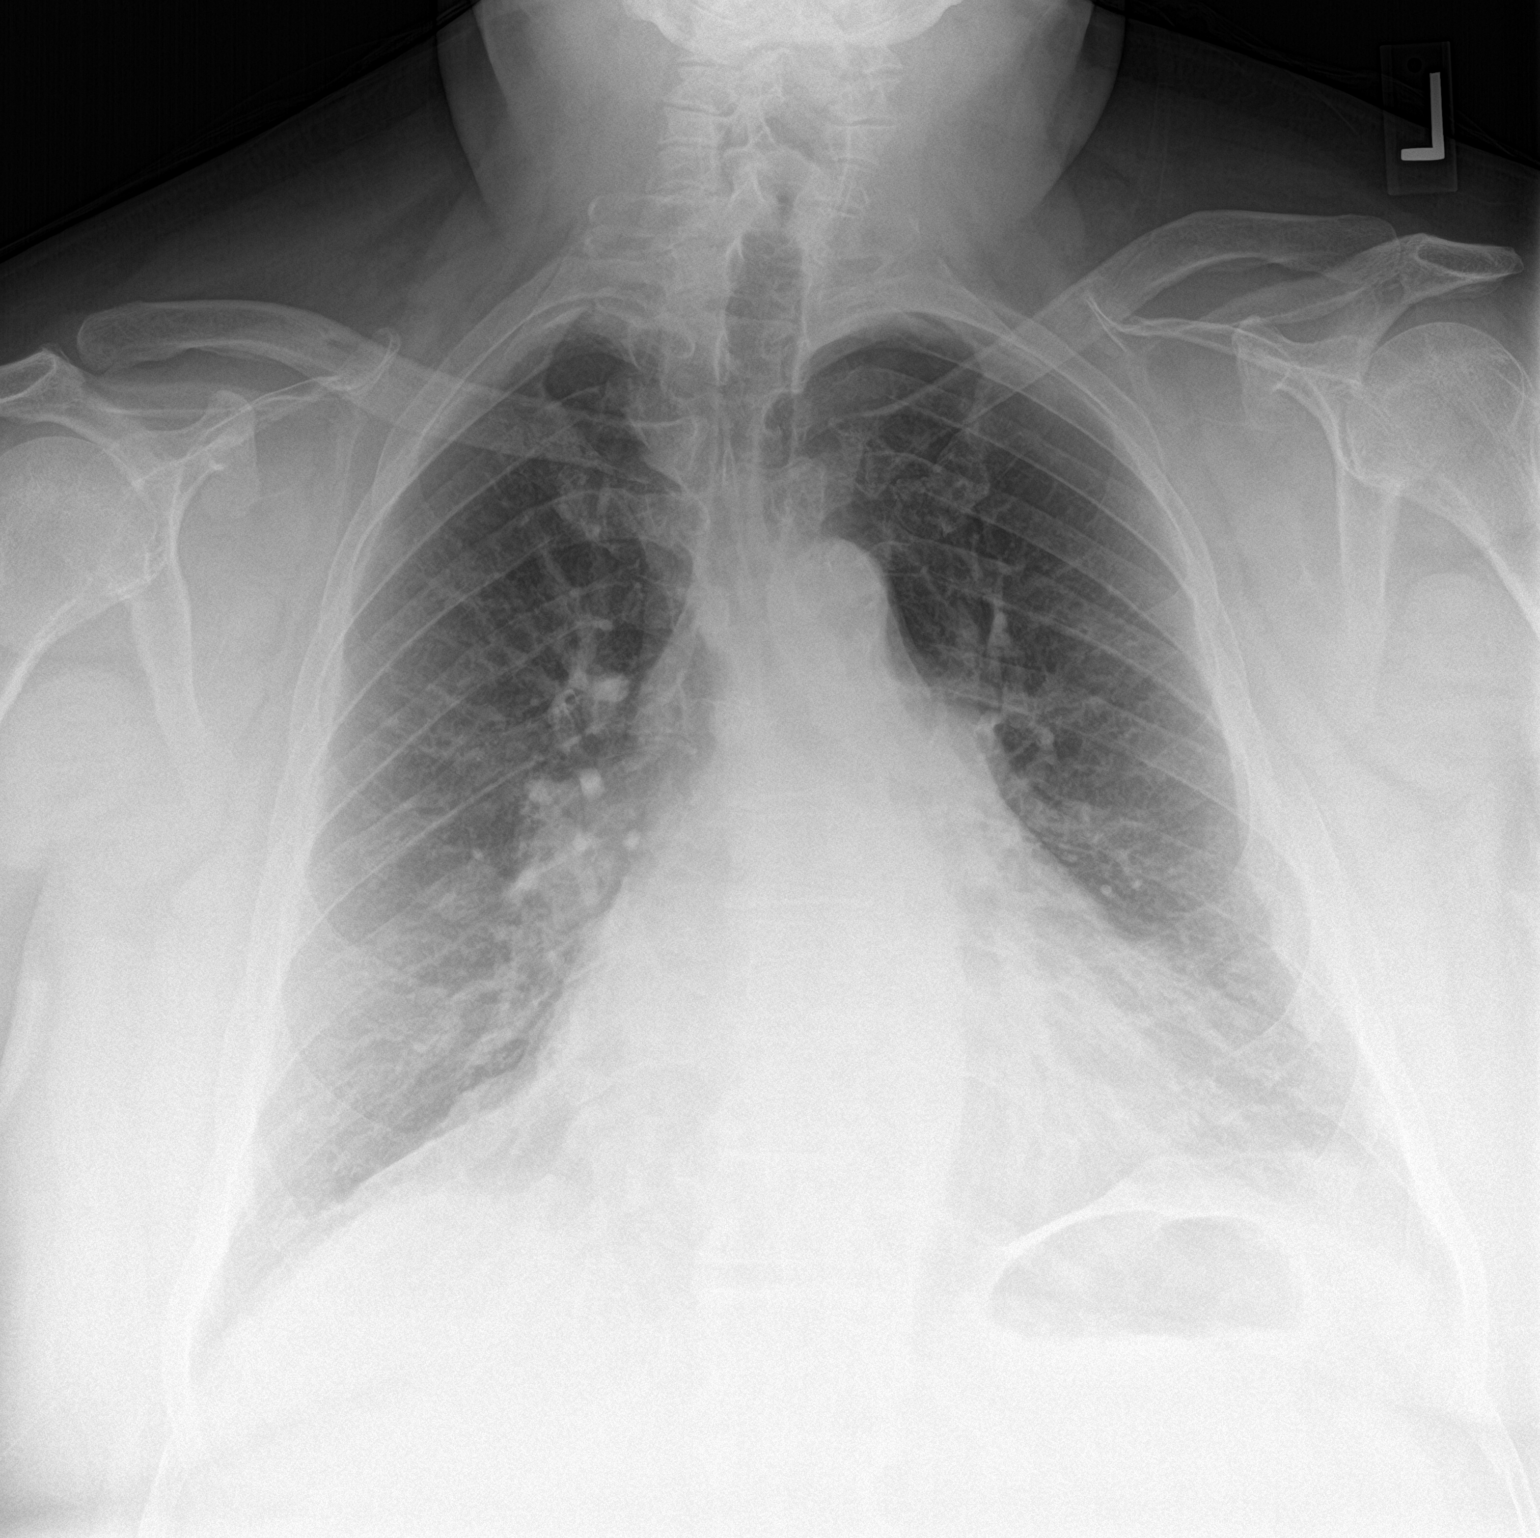

[chest lat]
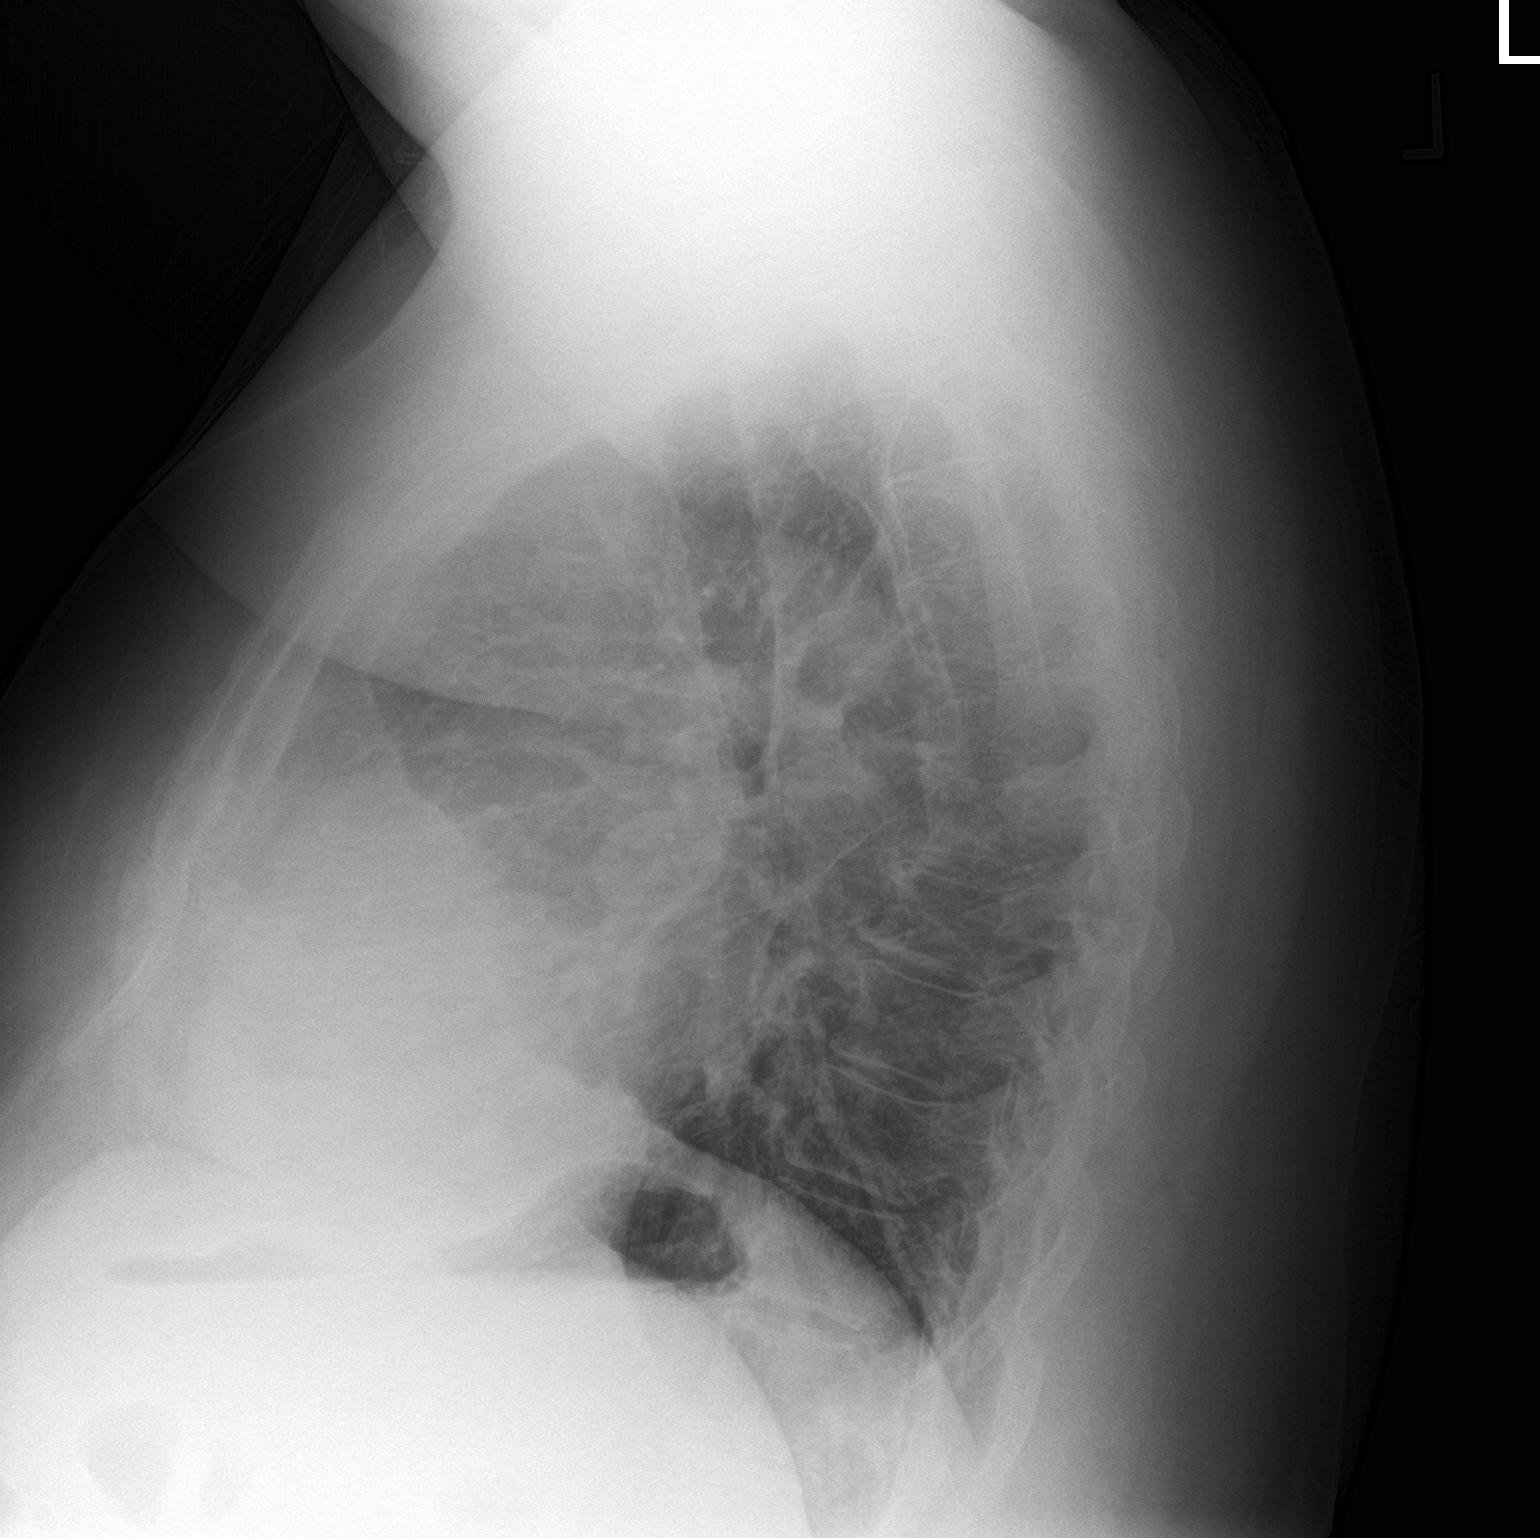

[2 of 2 positions shown; findings below may reference images not displayed]

FINDINGS: Enlargement of cardiac silhouette.

Mediastinal contours and pulmonary vascularity normal.

Chronic accentuation of basilar markings, greater on LEFT,
unchanged.

No acute infiltrate, pleural effusion or pneumothorax.

Bones demineralized.
IMPRESSION: Enlargement of cardiac silhouette.

Chronic accentuation of basilar markings without acute infiltrate.

## 2022-03-08 ENCOUNTER — Encounter: Payer: Self-pay | Admitting: Family Medicine

## 2022-03-08 ENCOUNTER — Telehealth (INDEPENDENT_AMBULATORY_CARE_PROVIDER_SITE_OTHER): Payer: BC Managed Care – PPO | Admitting: Family Medicine

## 2022-03-08 ENCOUNTER — Telehealth: Payer: Self-pay | Admitting: Family Medicine

## 2022-03-08 VITALS — Ht 71.0 in | Wt 270.0 lb

## 2022-03-08 DIAGNOSIS — J019 Acute sinusitis, unspecified: Secondary | ICD-10-CM

## 2022-03-08 MED ORDER — DOXYCYCLINE HYCLATE 100 MG PO TABS: 100.0000 mg | ORAL_TABLET | Freq: Two times a day (BID) | ORAL | 0 refills | Status: DC

## 2022-03-08 NOTE — Telephone Encounter (Signed)
Spoke with patient. Patient will log on for video visit today.  ?

## 2022-03-08 NOTE — Telephone Encounter (Signed)
Called patient to collect co-pay and patient was wondering could antibiotics be called in without an appointment, due to he has a lot going on and he was just seen for this same thing back a few months ago. I explained to patient that we would still need to keep the appointment so the provider could properly diagnose sx and send the correct medication in. Patient stated Dr Madilyn Fireman knew exactly what was wrong, it is the same thing that always is, & wanted me to send the message to see if antibiotics could be called in without the virtual visit. AM ?

## 2022-03-08 NOTE — Progress Notes (Signed)
? ? ?  Virtual Visit via Video Note ? ?I connected with Burr Medico on 03/08/22 at  9:50 AM EDT by a video enabled telemedicine application and verified that I am speaking with the correct person using two identifiers. ?  ?I discussed the limitations of evaluation and management by telemedicine and the availability of in person appointments. The patient expressed understanding and agreed to proceed. ? ?Patient location: at work ?Provider location: in office ? ?Subjective:   ? ?CC:   ?Chief Complaint  ?Patient presents with  ? Cough  ?  Productive cough, head congestion/nasal congestion, 2 days. No covid test   ? ? ?HPI: ?Reports sinus congestion and cough that started just a few days ago.  He is not had any fevers rash or other significant symptoms he does have a mild sore throat he feels like it is mostly just from the drainage.  He says he typically gets this every year.  He feels like a lot of it is allergy related but feels like it is turning into a sinus infection he did start his Flonase yesterday but has not noticed a lot of improvement.  He denies any significant shortness of breath. ? ? ?Past medical history, Surgical history, Family history not pertinant except as noted below, Social history, Allergies, and medications have been entered into the medical record, reviewed, and corrections made.  ? ? ?Objective:   ? ?General: Speaking clearly in complete sentences without any shortness of breath.  Alert and oriented x3.  Normal judgment. No apparent acute distress. ? ? ? ?Impression and Recommendations:   ? ?Problem List Items Addressed This Visit   ?None ?Visit Diagnoses   ? ? Acute non-recurrent sinusitis, unspecified location    -  Primary  ? Relevant Medications  ? doxycycline (VIBRA-TABS) 100 MG tablet  ? ?  ? ?acute sinusitis-discussed that this may still be viral but often times symptoms will last at least for a week if not a little longer usually its more like 2 weeks if it is a bacterial infection.   Did discuss starting his Flonase a little sooner with each season change as this is very typical time of year for him that there are some good studies that show reduction in sinus infections with getting on nasal steroid sprays a little sooner.  For now we will continue.  Also encouraged him to do a nasal saline rinse daily.  And if not improving in 1 week the please let us know I did get a send over an antibiotic to start if he feels like he is getting worse.  He typically gets at least 3-4 sinus infections a year so we did discuss also referral to ENT.  Encouraged him to consider it. ? ?No orders of the defined types were placed in this encounter. ? ? ?Meds ordered this encounter  ?Medications  ? doxycycline (VIBRA-TABS) 100 MG tablet  ?  Sig: Take 1 tablet (100 mg total) by mouth 2 (two) times daily.  ?  Dispense:  14 tablet  ?  Refill:  0  ? ? ? ?I discussed the assessment and treatment plan with the patient. The patient was provided an opportunity to ask questions and all were answered. The patient agreed with the plan and demonstrated an understanding of the instructions. ?  ?The patient was advised to call back or seek an in-person evaluation if the symptoms worsen or if the condition fails to improve as anticipated. ? ? ?Nani Gasser, MD  ? ?

## 2022-03-12 ENCOUNTER — Telehealth: Payer: Self-pay | Admitting: Family Medicine

## 2022-03-12 ENCOUNTER — Telehealth: Payer: Self-pay | Admitting: *Deleted

## 2022-03-12 NOTE — Telephone Encounter (Signed)
Patient dropped off FMLA paperwork for PCP. Patient stated he spoke with PCP and does not need an appointment at this time and is to just drop off papers. Patient was informed of a possible fee and 3-5 day turn around. Paperwork placed in providers box - lmr. ?

## 2022-03-12 NOTE — Telephone Encounter (Signed)
Pt called and stated that he will need to have FMLA forms filled out for [facility] [employer]. I advised him that he can drop off the forms however he will need to schedule an appt to discuss what he will need ?

## 2022-03-13 NOTE — Telephone Encounter (Signed)
Patient called and has issue - transferred call to Tonya B - - for further follow up. ?

## 2022-03-13 NOTE — Telephone Encounter (Signed)
Pt was advised when he called yesterday that he WOULD need an appointment and it could be a virtual if he could not do face to face for this. ? ?Please call him for scheduling thanks. ?

## 2022-03-14 ENCOUNTER — Telehealth (INDEPENDENT_AMBULATORY_CARE_PROVIDER_SITE_OTHER): Payer: BC Managed Care – PPO | Admitting: Family Medicine

## 2022-03-14 DIAGNOSIS — F430 Acute stress reaction: Secondary | ICD-10-CM | POA: Diagnosis not present

## 2022-03-14 DIAGNOSIS — Z636 Dependent relative needing care at home: Secondary | ICD-10-CM | POA: Diagnosis not present

## 2022-03-14 MED ORDER — DIAZEPAM 5 MG PO TABS: 2.5000 mg | ORAL_TABLET | Freq: Every day | ORAL | 0 refills | Status: DC | PRN

## 2022-03-14 NOTE — Assessment & Plan Note (Signed)
Acute distress, he already has a history of anxiety.  And PHQ-9 score was 21 today he does report having had thoughts of harming himself but says he has no active plans and his Ephriam Knuckles believes would not allow him to do that.  He feels strong in that faith.  We discussed just giving him some time out of work to come sort things out and figure out what those neck steps are going to be.  I did encourage him to consider therapy/counseling or even to start by reaching out to [facility] through work which is usually free and confidential.  I did go ahead and refill his diazepam today to again just use sparingly I like to follow back up in about 4 to 6 weeks to make sure that he is improving and doing well.  I will be happy to complete FMLA paperwork to write him out starting April 3 through May 15.  He is already off this week and that is why he is requesting that the leaves start on Monday. ?

## 2022-03-14 NOTE — Progress Notes (Signed)
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 He did try to speak with him about this to try to rectify this but was told that he had gotten "too close to a child" and would not be recommended by HR to be hired again.  ? ?He has been really stressed out about this situation he was told that he could either resign and this would not go on his record or he could use his sick days to ask for FMLA to (4/3-5/13) this will help him to look for employment but he would also lose this time otherwise.  ?

## 2022-03-14 NOTE — Progress Notes (Signed)
? ? ?Virtual Visit via Video Note ? ?I connected with Albert Humphrey on 03/14/22 at 11:10 AM EDT by a video enabled telemedicine application and verified that I am speaking with the correct person using two identifiers. ?  ?I discussed the limitations of evaluation and management by telemedicine and the availability of in person appointments. The patient expressed understanding and agreed to proceed. ? ?Patient location: at home ?Provider location: in office ? ?Subjective:   ? ?CC:  No chief complaint on file. ? ? ?HPI: ?He has been under a lot of stress recently.  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  He did try to speak with him about this to try to rectify this but was told that he had gotten "too close to a child" and would not be recommended by HR to be hired again.  He feels very stressed and overwhelmed because he feels like the accusations are inaccurate and that he does not have any way to defend himself.  And that he is being blamed for things that he did not do.  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  Is just feeling completely overwhelmed at this point and really just does not know quite what to do.  He did speak with HR and they recommended that maybe he take some time off.  He did request a refill on his Valium which she normally uses sparingly.  He is not resting and sleeping well.  He is feeling very overwhelmed. ?  ?He has been really stressed out about this situation he was told that he could either resign and this would not go on his record or he could use his sick days to ask for FMLA to (4/3-5/13) this will help him to look for employment but he would also lose this time otherwise. ? ?He was also recently evaluated on the 23rd for recurrent sinusitis he  had been sick on and off since January.  He just finished up antibiotics yesterday and still feels like he has a lot of tightness in his chest but is some better. ? 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 She has stage 4 cancer and she is 80 ? ?Flowsheet Row Video Visit from 03/14/2022 in [facility]  ?PHQ-9 Total Score 21  ? ?  ? ? ? ?Past medical history, Surgical history, Family history not pertinant except as noted below, Social history, Allergies, and medications have been entered into the medical record, reviewed, and corrections made.  ? ? ?Objective:   ? ?General: Speaking clearly in complete sentences without any shortness of breath.  Alert and oriented x3.  Normal judgment. No apparent acute distress. ? ? ? ?Impression and Recommendations:   ? ?Problem List Items Addressed This Visit   ? ?  ? Other  ? Caregiver burden  ? Acute reaction to stress - Primary  ?  Acute distress, he already has a history of anxiety.  And PHQ-9 score was 21 today he does report having had thoughts of harming himself but says he has no active plans and his Ephriam Knuckles believes would not allow him to do that.  He feels strong  in that faith.  We discussed just giving him some time out of work to come sort things out and figure out what those neck steps are going to be.  I did encourage him to consider therapy/counseling or even to start by reaching out to EAP through work which is usually free and confidential.  I did go ahead and refill his diazepam today to again just use sparingly I like to follow back up in about 4 to 6 weeks to make sure that he is improving and doing well.  I will be happy to complete FMLA paperwork to write him out starting April 3 through May 15.  He is already off this week and that is why he is requesting that the leaves start on Monday. ?  ?  ? Relevant Medications  ? diazepam (VALIUM) 5 MG tablet  ? ? ?No orders of the defined types were  placed in this encounter. ? ? ?Meds ordered this encounter  ?Medications  ? diazepam (VALIUM) 5 MG tablet  ?  Sig: Take 0.5-1 tablets (2.5-5 mg total) by mouth daily as needed for anxiety.  ?  Dispense:  20 tablet  ?  Refill:  0  ? ?FMLA paperwork completed for the above dates.  We will get that faxed over to the school system. ? ? ?I discussed the assessment and treatment plan with the patient. The patient was provided an opportunity to ask questions and all were answered. The patient agreed with the plan and demonstrated an understanding of the instructions. ?  ?The patient was advised to call back or seek an in-person evaluation if the symptoms worsen or if the condition fails to improve as anticipated. ? ? ?Nani Gasser, MD  ? ?

## 2022-03-16 ENCOUNTER — Telehealth: Payer: Self-pay | Admitting: *Deleted

## 2022-03-16 NOTE — Telephone Encounter (Signed)
LVM advising pt that his forms have been completed and that he can either call to pay for these or come by and pay for them. Forms placed up front. ?

## 2022-05-22 ENCOUNTER — Encounter: Payer: Self-pay | Admitting: Family Medicine

## 2022-05-22 DIAGNOSIS — Z131 Encounter for screening for diabetes mellitus: Secondary | ICD-10-CM

## 2022-05-22 MED ORDER — VICTOZA 18 MG/3ML ~~LOC~~ SOPN: PEN_INJECTOR | SUBCUTANEOUS | 3 refills | Status: AC

## 2022-05-31 ENCOUNTER — Other Ambulatory Visit: Payer: Self-pay | Admitting: Family Medicine

## 2022-05-31 DIAGNOSIS — I4891 Unspecified atrial fibrillation: Secondary | ICD-10-CM

## 2022-06-13 ENCOUNTER — Other Ambulatory Visit: Payer: Self-pay | Admitting: Family Medicine

## 2022-06-13 DIAGNOSIS — I4891 Unspecified atrial fibrillation: Secondary | ICD-10-CM

## 2022-06-18 ENCOUNTER — Other Ambulatory Visit: Payer: Self-pay | Admitting: Family Medicine

## 2022-07-20 ENCOUNTER — Other Ambulatory Visit: Payer: Self-pay | Admitting: Family Medicine

## 2022-07-20 DIAGNOSIS — N401 Enlarged prostate with lower urinary tract symptoms: Secondary | ICD-10-CM

## 2022-07-20 NOTE — Telephone Encounter (Signed)
Please contact patient to schedule 16-month follow-up for chronic issues. Thanks

## 2022-07-24 ENCOUNTER — Other Ambulatory Visit: Payer: Self-pay | Admitting: Family Medicine

## 2022-08-13 ENCOUNTER — Other Ambulatory Visit: Payer: Self-pay | Admitting: Family Medicine

## 2022-08-13 DIAGNOSIS — F419 Anxiety disorder, unspecified: Secondary | ICD-10-CM

## 2022-08-19 ENCOUNTER — Other Ambulatory Visit: Payer: Self-pay | Admitting: Family Medicine

## 2022-08-19 DIAGNOSIS — I4891 Unspecified atrial fibrillation: Secondary | ICD-10-CM

## 2022-08-21 ENCOUNTER — Ambulatory Visit: Payer: BC Managed Care – PPO | Admitting: Medical-Surgical

## 2022-08-21 ENCOUNTER — Encounter: Payer: Self-pay | Admitting: Medical-Surgical

## 2022-08-21 VITALS — BP 97/64 | HR 76 | Resp 20 | Ht 71.0 in | Wt 297.0 lb

## 2022-08-21 DIAGNOSIS — F430 Acute stress reaction: Secondary | ICD-10-CM | POA: Diagnosis not present

## 2022-08-21 DIAGNOSIS — R0602 Shortness of breath: Secondary | ICD-10-CM | POA: Diagnosis not present

## 2022-08-21 LAB — POC COVID19 BINAXNOW: SARS Coronavirus 2 Ag: NEGATIVE

## 2022-08-21 LAB — POCT INFLUENZA A/B
Influenza A, POC: NEGATIVE
Influenza B, POC: NEGATIVE

## 2022-08-21 MED ORDER — DIAZEPAM 5 MG PO TABS: 2.5000 mg | ORAL_TABLET | Freq: Every day | ORAL | 0 refills | Status: AC | PRN

## 2022-08-21 NOTE — Progress Notes (Signed)
Established Patient Office Visit  Subjective   Patient ID: Albert Humphrey, male   DOB: 1966-12-14 Age: 56 y.o. MRN: [id number]   Chief Complaint  Patient presents with   Shortness of Breath   HPI Very pleasant 56 year old male presenting today for evaluation of shortness of breath that started suddenly yesterday.  Notes that he does not feel specifically short of breath but instead feels as if he cannot get enough air in when he tries to take a deep breath.  He does have a known history of A-fib and is currently on Xarelto and Lopressor that keeps this managed.  Also notes that he has been under quite a bit of stress over the last year which is continuing now since he is relocating to [place] from [place].  Does not think that he has been exposed to anyone with acute illness but he does work in a school so he would like to rule out flu and COVID.  Denies fever, chills, chest pain, syncope, rhinorrhea, cough, sore throat, ear pain/pressure, and sinus congestion.  Currently taking Zoloft as prescribed for mood management.  Requesting a refill on Valium that he uses very sparingly and only for severe anxiety.   Objective:    Vitals:   08/21/22 1125  BP: 97/64  Pulse: 76  Resp: 20  Height: 5\' 11"  (1.803 m)  Weight: 297 lb (134.7 kg)  SpO2: 99%  BMI (Calculated): 41.44   Physical Exam Vitals reviewed.  Constitutional:      General: He is not in acute distress.    Appearance: Normal appearance. He is well-developed. He is obese. He is not ill-appearing.  HENT:     Head: Normocephalic and atraumatic.  Cardiovascular:     Rate and Rhythm: Normal rate. Rhythm irregular.     Pulses: Normal pulses.     Heart sounds: Normal heart sounds. No murmur heard.    No friction rub. No gallop.  Pulmonary:     Effort: Pulmonary effort is normal. No respiratory distress.     Breath sounds: Normal breath sounds.  Skin:    General: Skin is warm and dry.  Neurological:     Mental Status: He is  alert and oriented to person, place, and time.  Psychiatric:        Mood and Affect: Mood normal.        Behavior: Behavior normal.        Thought Content: Thought content normal.        Judgment: Judgment normal.    Results for orders placed or performed in visit on 08/21/22 (from the past 24 hour(s))  POC COVID-19     Status: None   Collection Time: 08/21/22 12:18 PM  Result Value Ref Range   SARS Coronavirus 2 Ag Negative Negative  POCT Influenza A/B     Status: None   Collection Time: 08/21/22 12:18 PM  Result Value Ref Range   Influenza A, POC Negative Negative   Influenza B, POC Negative Negative       The 10-year ASCVD risk score (Arnett DK, et al., 2019) is: 12.2%   Values used to calculate the score:     Age: 89 years     Sex: Male     Is Non-Hispanic African American: No     Diabetic: No     Tobacco smoker: Yes     Systolic Blood Pressure: 97 mmHg     Is BP treated: Yes     HDL Cholesterol: 41 mg/dL  Total Cholesterol: 235 mg/dL   Assessment & Plan:   1. Acute reaction to stress Suspect that his shortness of breath is multifactorial including increased anxiety and deconditioned body habitus.  Discussed very sparing use of Valium.  He has been responsible with this so we will go ahead and refill this today.  Continue sertraline 200 mg daily as prescribed. - diazepam (VALIUM) 5 MG tablet; Take 0.5-1 tablets (2.5-5 mg total) by mouth daily as needed for anxiety.  Dispense: 20 tablet; Refill: 0  2. Shortness of breath Checking labs as below.  POCT influenza and COVID swabs both negative.  Chest x-ray today.  Discussed the relation between weight, physical activity, and dyspnea (especially on exertion).  Agree that increase stress level/anxiety is probably playing a role at this point.  No red flags on exam today.  Heart rate is irregular with chronic A-fib however rate is very well controlled and blood pressure is stable at his baseline. - CBC with  Differential/Platelet - COMPLETE METABOLIC PANEL WITH GFR - Lipid panel - TSH - POC COVID-19 - POCT Influenza A/B  Return if symptoms worsen or fail to improve.  ___________________________________________ Thayer Ohm, DNP, APRN, FNP-BC Primary Care and Sports Medicine [facility] [place]

## 2022-11-10 ENCOUNTER — Other Ambulatory Visit: Payer: Self-pay | Admitting: Family Medicine

## 2022-11-10 DIAGNOSIS — F419 Anxiety disorder, unspecified: Secondary | ICD-10-CM

## 2022-11-21 ENCOUNTER — Other Ambulatory Visit: Payer: Self-pay | Admitting: Family Medicine

## 2022-11-21 DIAGNOSIS — I4891 Unspecified atrial fibrillation: Secondary | ICD-10-CM

## 2022-12-06 ENCOUNTER — Other Ambulatory Visit: Payer: Self-pay | Admitting: Family Medicine

## 2022-12-06 DIAGNOSIS — I4891 Unspecified atrial fibrillation: Secondary | ICD-10-CM

## 2022-12-17 ENCOUNTER — Other Ambulatory Visit: Payer: Self-pay | Admitting: Family Medicine

## 2022-12-17 DIAGNOSIS — I4891 Unspecified atrial fibrillation: Secondary | ICD-10-CM

## 2023-01-01 ENCOUNTER — Other Ambulatory Visit: Payer: Self-pay | Admitting: Family Medicine

## 2023-01-01 DIAGNOSIS — M1A0710 Idiopathic chronic gout, right ankle and foot, without tophus (tophi): Secondary | ICD-10-CM

## 2023-01-01 DIAGNOSIS — M10071 Idiopathic gout, right ankle and foot: Secondary | ICD-10-CM

## 2023-01-26 ENCOUNTER — Other Ambulatory Visit: Payer: Self-pay | Admitting: Family Medicine

## 2023-01-26 DIAGNOSIS — I4891 Unspecified atrial fibrillation: Secondary | ICD-10-CM

## 2023-02-11 ENCOUNTER — Other Ambulatory Visit: Payer: Self-pay | Admitting: Family Medicine

## 2023-02-11 DIAGNOSIS — F419 Anxiety disorder, unspecified: Secondary | ICD-10-CM

## 2023-02-11 NOTE — Telephone Encounter (Signed)
Please call pt he is overdue for OV  and fasting labs

## 2023-02-11 NOTE — Telephone Encounter (Signed)
Lvm for patient to call back to schedule an appointment for an overdue OV and fasting labs with Dr. Madilyn Fireman. Tvt

## 2023-04-12 ENCOUNTER — Other Ambulatory Visit: Payer: Self-pay | Admitting: Family Medicine

## 2023-04-12 DIAGNOSIS — I4891 Unspecified atrial fibrillation: Secondary | ICD-10-CM

## 2023-04-15 NOTE — Telephone Encounter (Signed)
Called patient, Patient has moved to [facility], patient states that he has changed PCP's, thanks.

## 2023-04-15 NOTE — Telephone Encounter (Signed)
Please call pt he will need to come in for an office visit for refills on this medication.

## 2023-04-15 NOTE — Telephone Encounter (Signed)
Information updated in pt's chart to reflect that Dr. Linford Arnold is no longer pcp

## 2023-05-13 ENCOUNTER — Other Ambulatory Visit: Payer: Self-pay | Admitting: Family Medicine

## 2023-05-13 DIAGNOSIS — F419 Anxiety disorder, unspecified: Secondary | ICD-10-CM

## 2023-06-05 ENCOUNTER — Other Ambulatory Visit: Payer: Self-pay | Admitting: Family Medicine

## 2023-06-05 DIAGNOSIS — M10071 Idiopathic gout, right ankle and foot: Secondary | ICD-10-CM

## 2023-06-05 DIAGNOSIS — M1A0710 Idiopathic chronic gout, right ankle and foot, without tophus (tophi): Secondary | ICD-10-CM
